# Patient Record
Sex: Female | Born: 1950 | Hispanic: No | Marital: Married | Smoking: Former smoker
Health system: Southern US, Community
[De-identification: ages and names within clinical notes are randomized; demographics above are authoritative.]

## PROBLEM LIST (undated history)

## (undated) DIAGNOSIS — Z8669 Personal history of other diseases of the nervous system and sense organs: Secondary | ICD-10-CM

## (undated) DIAGNOSIS — I959 Hypotension, unspecified: Secondary | ICD-10-CM

## (undated) DIAGNOSIS — M1990 Unspecified osteoarthritis, unspecified site: Secondary | ICD-10-CM

## (undated) DIAGNOSIS — E669 Obesity, unspecified: Secondary | ICD-10-CM

## (undated) DIAGNOSIS — Z91013 Allergy to seafood: Secondary | ICD-10-CM

## (undated) DIAGNOSIS — E785 Hyperlipidemia, unspecified: Secondary | ICD-10-CM

## (undated) HISTORY — PX: HIP SURGERY: SHX245

## (undated) HISTORY — DX: Personal history of other diseases of the nervous system and sense organs: Z86.69

## (undated) HISTORY — DX: Hypotension, unspecified: I95.9

## (undated) HISTORY — PX: FOOT SURGERY: SHX648

## (undated) HISTORY — DX: Unspecified osteoarthritis, unspecified site: M19.90

## (undated) HISTORY — DX: Allergy to seafood: Z91.013

## (undated) HISTORY — PX: DERMABRASION OF ARM: SUR410

## (undated) HISTORY — DX: Hyperlipidemia, unspecified: E78.5

## (undated) HISTORY — DX: Obesity, unspecified: E66.9

---

## 2005-09-29 ENCOUNTER — Ambulatory Visit: Payer: Self-pay | Admitting: Family Medicine

## 2007-01-21 ENCOUNTER — Ambulatory Visit: Payer: Self-pay | Admitting: Family Medicine

## 2007-03-10 ENCOUNTER — Encounter: Payer: Self-pay | Admitting: Internal Medicine

## 2007-03-10 DIAGNOSIS — Z8701 Personal history of pneumonia (recurrent): Secondary | ICD-10-CM | POA: Insufficient documentation

## 2007-03-10 DIAGNOSIS — G43909 Migraine, unspecified, not intractable, without status migrainosus: Secondary | ICD-10-CM

## 2007-03-19 ENCOUNTER — Encounter: Payer: Self-pay | Admitting: Internal Medicine

## 2007-03-22 ENCOUNTER — Ambulatory Visit: Payer: Self-pay | Admitting: Family Medicine

## 2007-03-22 DIAGNOSIS — L509 Urticaria, unspecified: Secondary | ICD-10-CM | POA: Insufficient documentation

## 2007-03-22 DIAGNOSIS — R635 Abnormal weight gain: Secondary | ICD-10-CM

## 2007-04-01 ENCOUNTER — Encounter (INDEPENDENT_AMBULATORY_CARE_PROVIDER_SITE_OTHER): Payer: Self-pay | Admitting: Internal Medicine

## 2007-04-28 ENCOUNTER — Ambulatory Visit: Payer: Self-pay | Admitting: Family Medicine

## 2007-12-07 ENCOUNTER — Emergency Department: Payer: Self-pay | Admitting: Emergency Medicine

## 2008-01-30 IMAGING — CR DG SHOULDER 1V*L*
1 series · 1 of 1 positions shown · non-contrast
Comparison: none

REASON FOR EXAM: Post reduction x-ray for left shoulder dislocation
COMMENTS:   LMP: Post-Menopausal

PROCEDURE:     DXR - DXR SHOULDER LEFT ONE VIEW  - December 07, 2007  [DATE]
RESULT:     The patient has undergone interval relocation of the previously
dislocated LEFT shoulder. Alignment now appears anatomic. The AC joint is
grossly intact. I see no evidence of acute fracture.

[view not recorded]
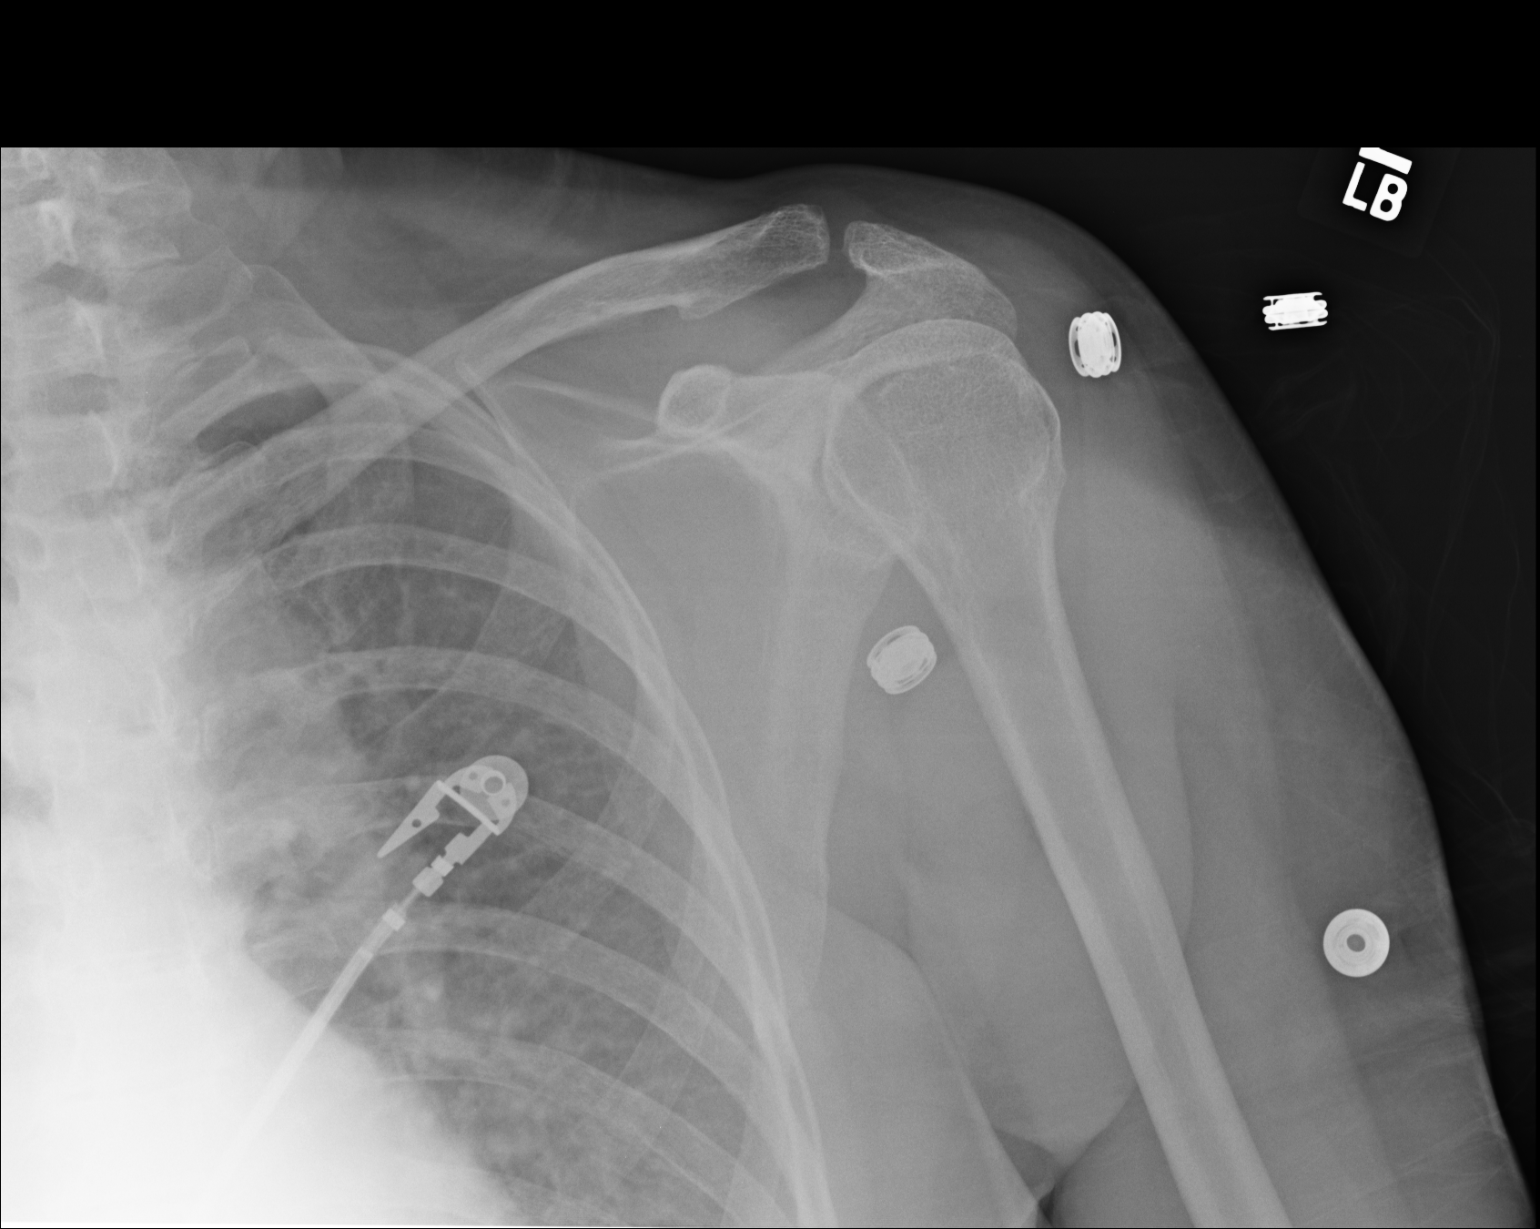

[1 of 1 positions shown; findings below may reference images not displayed]

IMPRESSION: The patient has undergone successful relocation of the
previously dislocated LEFT shoulder.

## 2008-01-30 IMAGING — CR DG ELBOW 2V*L*
1 series · 1 of 1 positions shown · non-contrast
Comparison: none

REASON FOR EXAM: Post reduction x-ray
COMMENTS:   LMP: Post-Menopausal

[view not recorded]
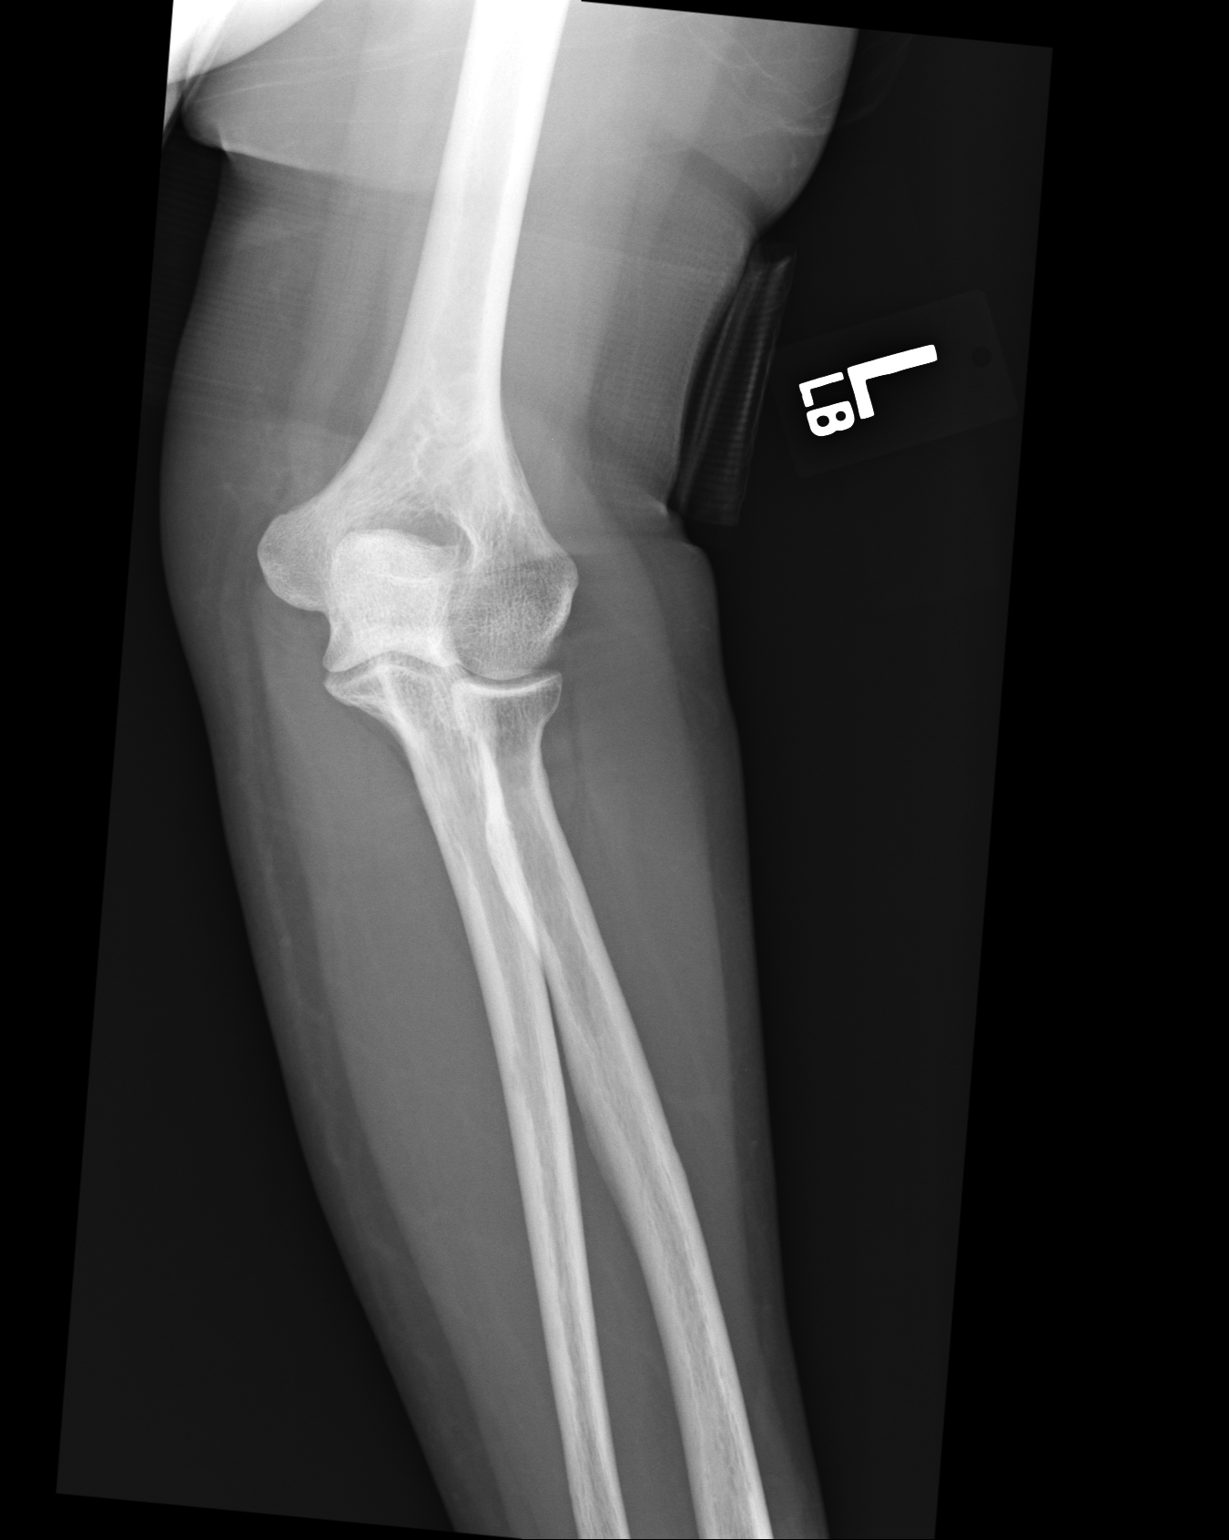

[1 of 1 positions shown; findings below may reference images not displayed]

PROCEDURE:     DXR - DXR ELBOW LEFT AP AND LATERAL  - December 07, 2007  [DATE]

RESULT:     The patient is undergoing evaluation of the LEFT elbow due to an
unusual appearance on an accompanying study of the dislocated LEFT shoulder.
The radial head appears intact. The olecranon also appears intact. The
distal humerus is normal in appearance.
IMPRESSION: I do not see evidence of acute fracture of the LEFT elbow.

## 2008-04-27 ENCOUNTER — Encounter (INDEPENDENT_AMBULATORY_CARE_PROVIDER_SITE_OTHER): Payer: Self-pay | Admitting: Internal Medicine

## 2008-04-28 ENCOUNTER — Telehealth (INDEPENDENT_AMBULATORY_CARE_PROVIDER_SITE_OTHER): Payer: Self-pay | Admitting: Internal Medicine

## 2008-06-13 ENCOUNTER — Ambulatory Visit: Payer: Self-pay | Admitting: Family Medicine

## 2008-06-22 ENCOUNTER — Ambulatory Visit: Payer: Self-pay | Admitting: Family Medicine

## 2008-08-15 IMAGING — US US EXTREM UP VENOUS*L*
1 series · 18 of 24 positions shown · non-contrast
Comparison: none

REASON FOR EXAM: Swelling left wrist, evaluate for blood clot
   Call report to: [phone]
COMMENTS:

PROCEDURE:     US  - US DOPPLER UP EXTR LEFT  - June 22, 2008  [DATE]
RESULT:     Doppler examination of the venous system of the LEFT upper
extremity was performed from the level of the jugular vein peripherally to
the elbow. No thrombus or occlusion is seen.

[Series 1: us extrem up venous*left* · 18 of 39 slices shown]
[im 1/39]
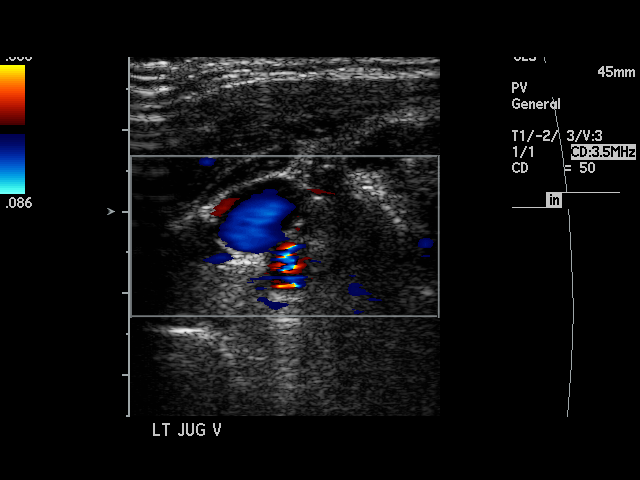
[im 4/39]
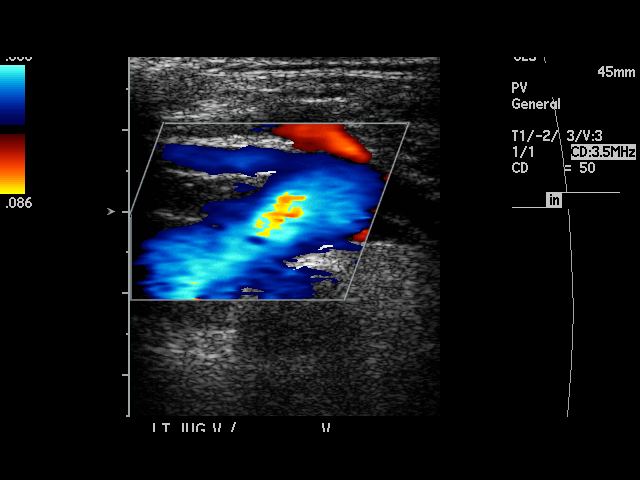
[im 5/39]
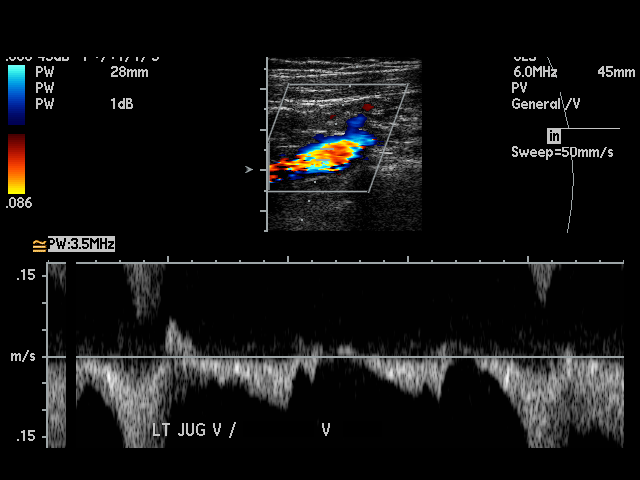
[im 7/39]
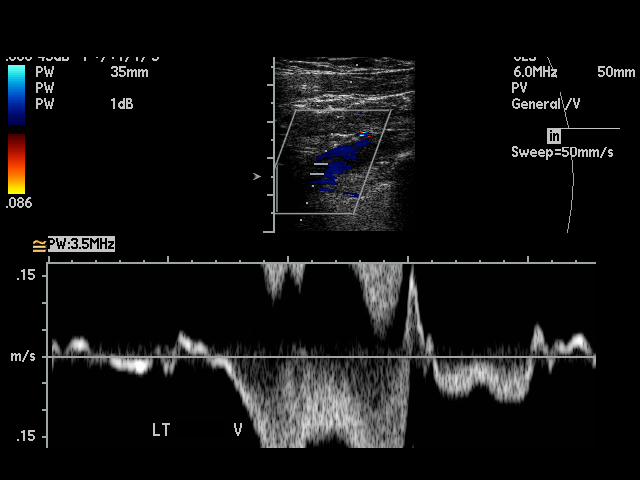
[im 10/39]
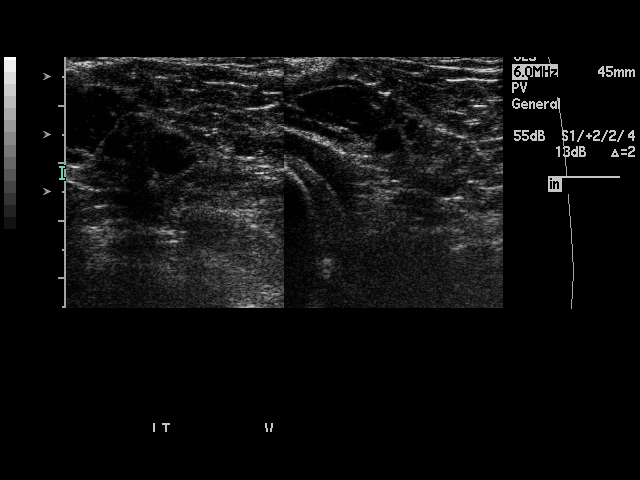
[im 12/39]
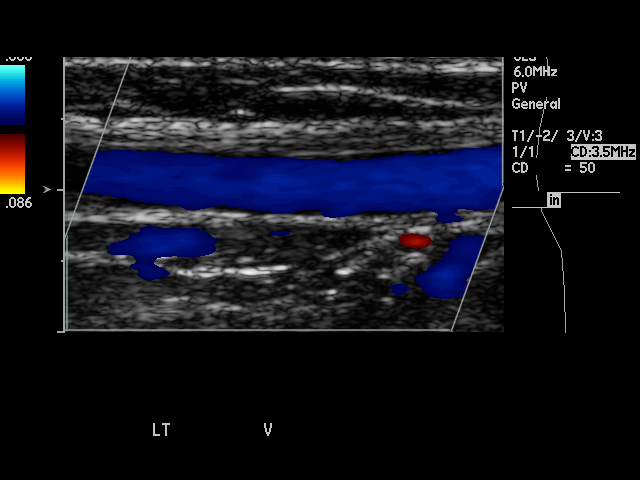
[im 14/39]
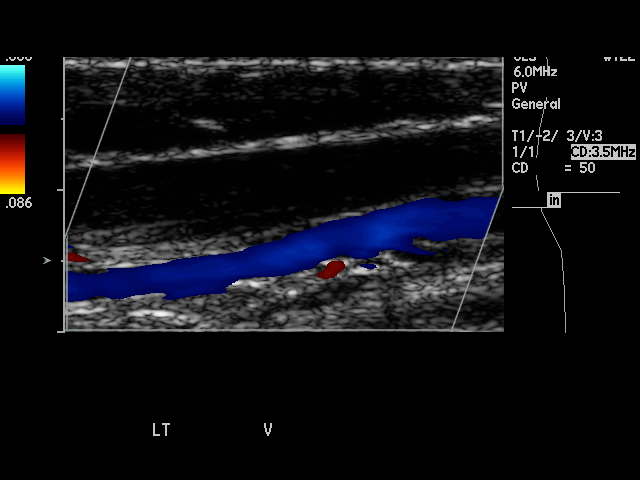
[im 17/39]
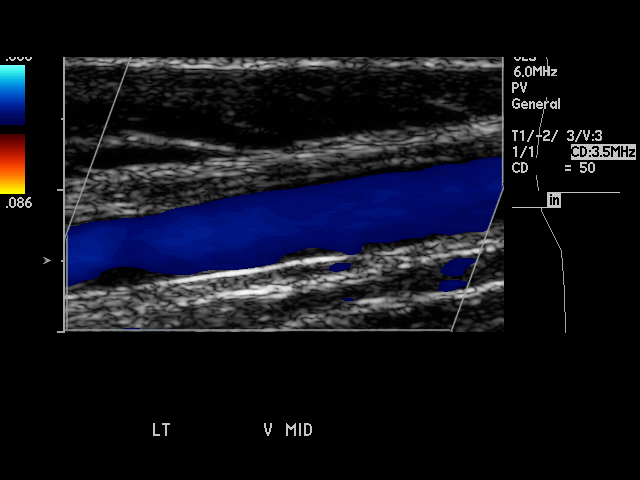
[im 19/39]
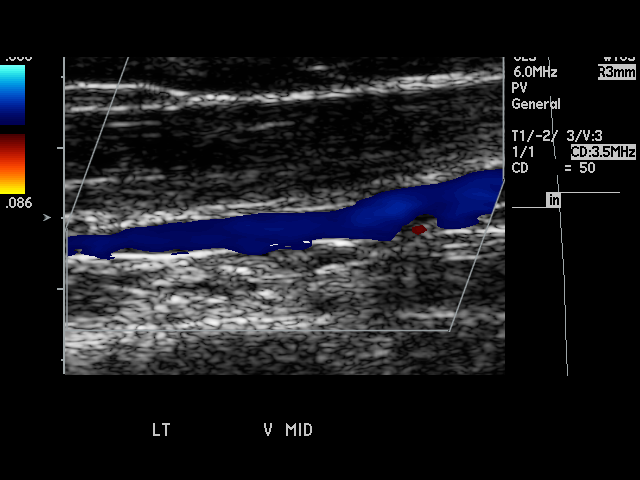
[im 20/39]
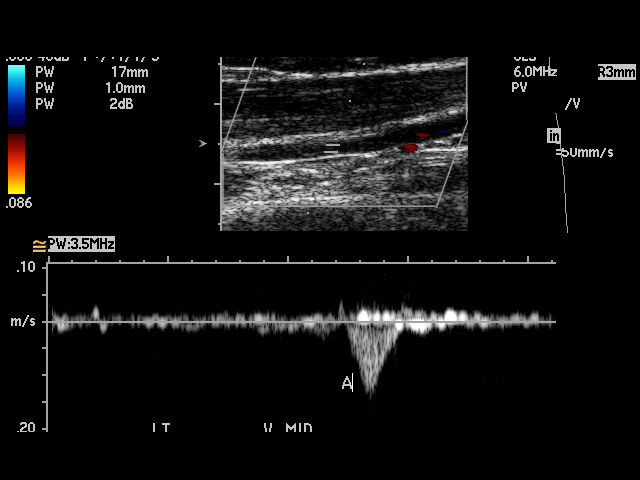
[im 24/39]
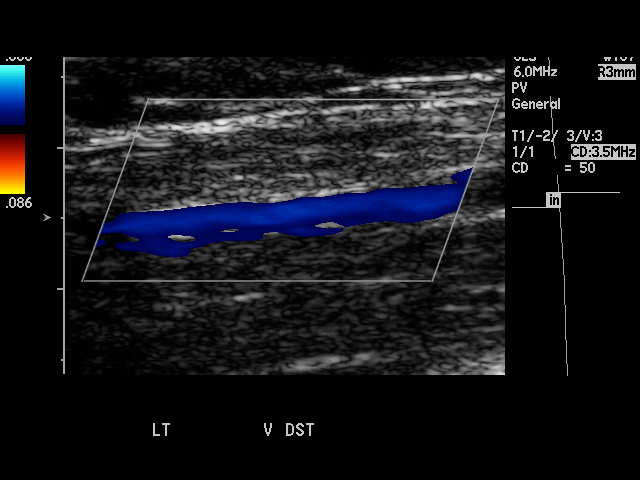
[im 25/39]
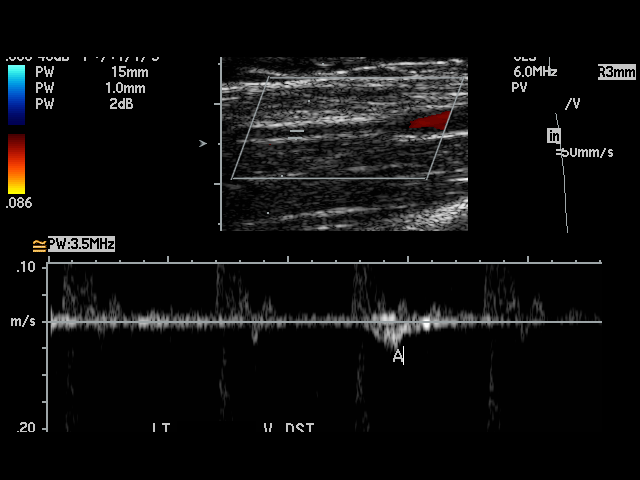
[im 27/39]
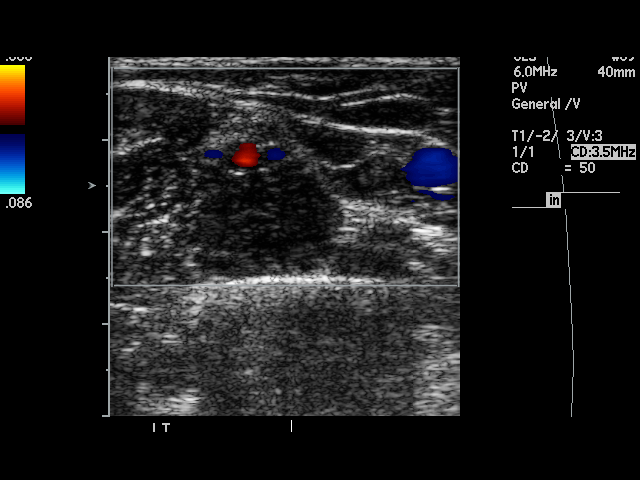
[im 30/39]
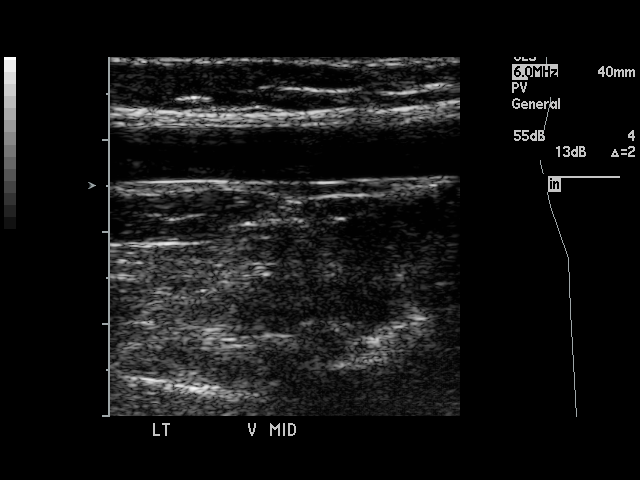
[im 32/39]
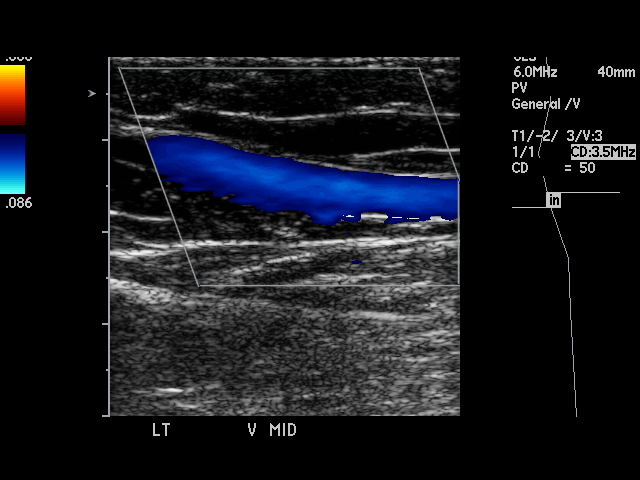
[im 34/39]
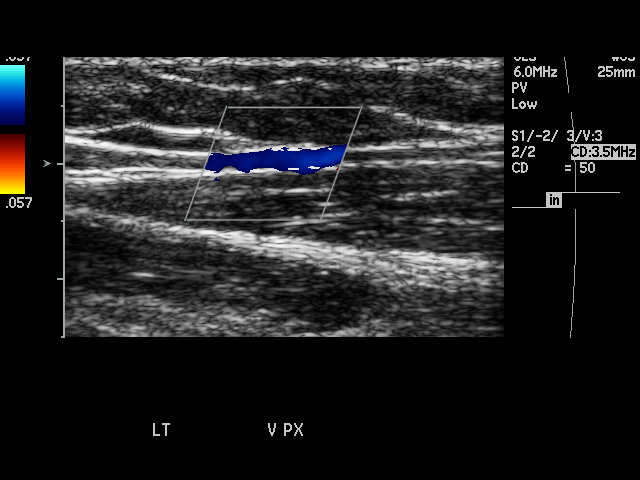
[im 37/39]
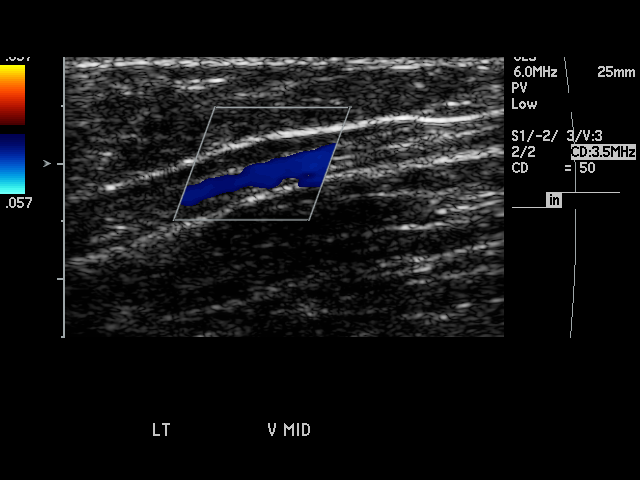
[im 39/39]
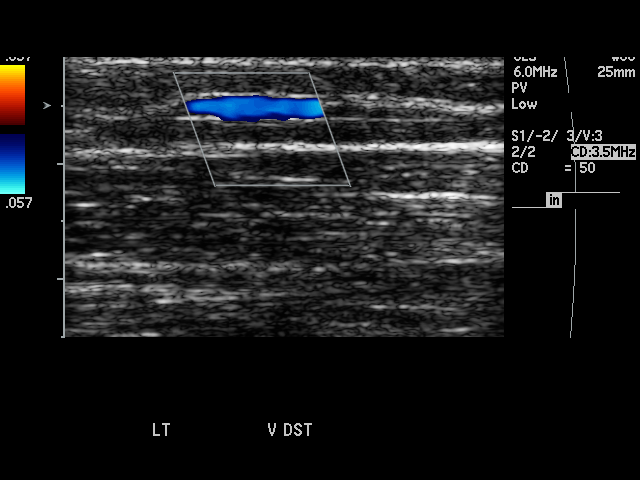

[18 of 24 positions shown; findings below may reference images not displayed]

IMPRESSION: Normal study.

## 2008-11-24 HISTORY — PX: TOTAL HIP ARTHROPLASTY: SHX124

## 2009-03-16 ENCOUNTER — Telehealth (INDEPENDENT_AMBULATORY_CARE_PROVIDER_SITE_OTHER): Payer: Self-pay | Admitting: Internal Medicine

## 2009-05-04 ENCOUNTER — Encounter (INDEPENDENT_AMBULATORY_CARE_PROVIDER_SITE_OTHER): Payer: Self-pay | Admitting: Internal Medicine

## 2009-05-30 ENCOUNTER — Encounter (INDEPENDENT_AMBULATORY_CARE_PROVIDER_SITE_OTHER): Payer: Self-pay | Admitting: Internal Medicine

## 2009-08-22 ENCOUNTER — Ambulatory Visit: Payer: Self-pay | Admitting: Family Medicine

## 2009-08-22 LAB — CONVERTED CEMR LAB
Bilirubin Urine: NEGATIVE
Specific Gravity, Urine: 1.01
pH: 6

## 2009-11-01 ENCOUNTER — Encounter (INDEPENDENT_AMBULATORY_CARE_PROVIDER_SITE_OTHER): Payer: Self-pay | Admitting: Internal Medicine

## 2010-01-25 ENCOUNTER — Ambulatory Visit: Payer: Self-pay | Admitting: Family Medicine

## 2010-01-29 ENCOUNTER — Telehealth: Payer: Self-pay | Admitting: Family Medicine

## 2010-01-30 ENCOUNTER — Ambulatory Visit: Payer: Self-pay | Admitting: Family Medicine

## 2010-01-30 DIAGNOSIS — J209 Acute bronchitis, unspecified: Secondary | ICD-10-CM

## 2010-04-25 ENCOUNTER — Encounter: Payer: Self-pay | Admitting: Family Medicine

## 2010-09-10 ENCOUNTER — Encounter: Payer: Self-pay | Admitting: Family Medicine

## 2010-09-10 ENCOUNTER — Ambulatory Visit: Payer: Self-pay | Admitting: Internal Medicine

## 2010-09-10 DIAGNOSIS — B353 Tinea pedis: Secondary | ICD-10-CM | POA: Insufficient documentation

## 2010-09-10 DIAGNOSIS — Z91013 Allergy to seafood: Secondary | ICD-10-CM

## 2010-09-10 LAB — CONVERTED CEMR LAB
BUN: 17 mg/dL
CO2: 25 meq/L
Calcium: 9.1 mg/dL
Creatinine, Ser: 0.74 mg/dL
Glucose, Bld: 95 mg/dL

## 2010-09-11 ENCOUNTER — Encounter: Payer: Self-pay | Admitting: Family Medicine

## 2010-09-19 ENCOUNTER — Telehealth: Payer: Self-pay | Admitting: Family Medicine

## 2010-09-29 ENCOUNTER — Encounter: Payer: Self-pay | Admitting: Family Medicine

## 2010-10-03 ENCOUNTER — Encounter (INDEPENDENT_AMBULATORY_CARE_PROVIDER_SITE_OTHER): Payer: Self-pay | Admitting: *Deleted

## 2010-10-03 ENCOUNTER — Encounter: Payer: Self-pay | Admitting: Family Medicine

## 2010-11-28 ENCOUNTER — Encounter: Payer: Self-pay | Admitting: Family Medicine

## 2010-12-24 NOTE — Assessment & Plan Note (Signed)
Summary: TRANSFER FROM BILLIE,REFILL HA MED/CLE   Vital Signs:  Patient profile:   60 year old female Weight:      169 pounds Temp:     98.5 degrees F oral Pulse rate:   76 / minute Pulse rhythm:   regular BP sitting:   112 / 70  (left arm) Cuff size:   regular  Vitals Entered By: Selena Batten Dance CMA Duncan Dull) (September 10, 2010 8:15 AM) CC: Refill Headache meds   History of Present Illness: CC: establish, refill meds.  swims 6d/wk.  eats well.  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    joints - R hip replaced May 2010, went to Dr. Sherin Quarry at [facility].  Practice in [address].  doing well.    migraines - relpax works if taking two or one if takes too early on.  started after menopause.  seem to come in 30 day cycles.  gets one every month.  Skin - wants spot checked on right anterior neck, looks like pimple.  wants to make sure not malignant.  also fungal infection in feet, using OTC lotrimin cream and spray.  preventative - [facility] Dr. Haskel Khan for well woman.  mammo/pap/breast exam normal last year, gets every 2 years.  flu shot 1 year ago.  to get today.  hasn't had blood work since 2008.  due for glu check. fasting today  -  Date:  03/08/2009    TD booster Tdap  Current Medications (verified): 1)  Multivitamins   Tabs (Multiple Vitamin) .... One Daily 2)  Glucosamine-Chondroitin  Caps (Glucosamine-Chondroit-Vit C-Mn) .... Otc As Directed. 3)  Relpax 40 Mg Tabs (Eletriptan Hydrobromide) .... Take 1 At Onset of Migraine, May Repeat in 2 Hrs If Not Resolved As Needed  Allergies: 1)  ! Pcn 2)  ! Morphine 3)  ! 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 Tresa Endo)  Family History: adopted, part native [place]  Social History: Remote smoking, occ EtOH, no rec drugs Occupation: Engineer, civil (consulting)  at [employer]  Marital Status: Married, lives with husband, no pets Children: 3  Review of Systems       The patient complains of weight gain and headaches.  The patient denies anorexia, fever, weight loss, vision loss, decreased hearing, hoarseness, chest pain, syncope, dyspnea on exertion, peripheral edema, prolonged cough, hemoptysis, abdominal pain, melena, hematochezia, severe indigestion/heartburn, hematuria, muscle weakness, difficulty walking, depression, and breast masses.    Physical Exam  General:  Well-developed,well-nourished,in no acute distress; alert,appropriate and cooperative throughout examination Head:  normocephalic, atraumatic, and no abnormalities observed.  no sinus tenderness  Eyes:  vision grossly intact, pupils equal, pupils round, pupils reactive to light, and no injection.   Ears:  R ear normal and L ear normal.   Nose:  nares are clear Mouth:  pharynx pink and moist, no erythema, and no exudates.   Neck:  No deformities, masses, or tenderness noted.  + small raised cherry angioma R anterior neck Lungs:  Normal respiratory effort, chest expands symmetrically. Lungs are clear to auscultation, no crackles or wheezes. Heart:  Normal rate and regular rhythm. S1 and S2 normal without gallop, murmur, click, rub or other extra sounds. Msk:  No deformity or scoliosis noted of thoracic or lumbar spine.   Pulses:  2+ rad pulses Extremities:  no edema Neurologic:  CN grossly  intact, station and gait intact Skin:  feet bilaterally with slight dry skin/cracking soles, L>R,  normal between toes.   Psych:  normal affect, talkative and pleasant    Impression & Recommendations:  Problem # 1:  WEIGHT GAIN (ICD-783.1) check glu.   Orders: TLB-BMP (Basic Metabolic Panel-BMET) (80048-METABOL)  Problem # 2:  WELL ADULT (ICD-V70.0) preventative updated.  iFOB given today, check BMP.  receives well woman from OBGYN  Problem # 3:  MIGRAINES, HX OF  (ICD-V13.8) refilled relpax.  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  sent in epi pen.  pt declines referral to allergist.  Problem # 5:  TINEA PEDIS (ICD-110.4)  twice daily lotrimin, spray in all shoes.  rtc if not improved.  Complete Medication List: 1)  Multivitamins Tabs (Multiple vitamin) .... One daily 2)  Glucosamine-chondroitin Caps (Glucosamine-chondroit-vit c-mn) .... Otc as directed. 3)  Relpax 40 Mg Tabs (Eletriptan hydrobromide) .... Take 1 at onset of migraine, may repeat in 2 hrs if not resolved as needed 4)  Epipen 0.3 Mg/0.5ml Devi (Epinephrine) .... Use as directed.  Patient Instructions: 1)  Return as needed or in 1 year for complete physical.   2)  sent home with iFOB.  Please send to [facility]. 3)  relpax refilled. 4)  Good to meet you today, call clinic with questions. Prescriptions: EPIPEN 0.3 MG/0.3ML DEVI (EPINEPHRINE) use as directed.  #1 x 1   Entered and Authorized by:   [name]  MD   Signed by:   [name]  MD on 09/10/2010   Method used:   Electronically to        [facility]* (retail)       [address]       [place], [place]  [address]       Ph: [phone]       Fax: [phone]   RxID:   [id number] RELPAX 40 MG TABS (ELETRIPTAN HYDROBROMIDE) take 1 at onset of migraine, may repeat in 2 hrs if not resolved as needed  #9 x 6   Entered and Authorized by:   [name]  MD   Signed by:   [name]  MD on 09/10/2010   Method used:   Electronically to        [facility]* (retail)       [address]       [place], [place]  [address]       Ph: [phone]       Fax: [phone]   RxID:   [id number]    Orders Added: 1)  TLB-BMP (Basic Metabolic Panel-BMET) [80048-METABOL] 2)  Est. Patient 40-64 years [[address]]    Current Allergies (reviewed today): ! PCN ! MORPHINE ! DEMEROL  Prevention & Chronic  Care Immunizations   Influenza vaccine: Not documented    Tetanus booster: 03/08/2009: Tdap    Pneumococcal vaccine: Not documented  Colorectal Screening   Hemoccult: Not documented    Colonoscopy: Not documented  Other Screening   Pap smear: Not documented    Mammogram: Not documented   Smoking status: quit  (03/10/2007)  Lipids   Total Cholesterol: Not documented   LDL: Not documented   LDL Direct: Not documented   HDL: Not documented   Triglycerides: Not documented     Appended Document: TRANSFER FROM BILLIE,REFILL HA MED/CLE    Clinical Lists Changes  Orders:  Added new Service order of Specimen Handling (51884) - Signed

## 2010-12-24 NOTE — Miscellaneous (Signed)
Summary: iFOB repeat  Clinical Lists Changes  Observations: Added new observation of HEMOCULTDUE: 09/2011 (10/03/2010 9:46) Added new observation of HEMOCCULT: negative (09/29/2010 9:47)      Preventive Care Screening  Hemoccult:    Date:  09/29/2010    Next Due:  09/2011    Results:  negative

## 2010-12-24 NOTE — Letter (Signed)
Summary: [facility]: Immunoassay Fecal Occult Blood (iFOB) Order Form  [place] at [facility]  [address] [place], [place] [address]   Phone: [phone]  Fax: [phone]      [facility]: Immunoassay Fecal Occult Blood (iFOB) Order Form   September 10, 2010 MRN: [id number]   Dana Alvarez [birth year]   Physicican Name:_________________________  Diagnosis Code:__________V76.49_______________      [name]  MD

## 2010-12-24 NOTE — Assessment & Plan Note (Signed)
Summary: ?BRONCHITIS   Vital Signs:  Patient profile:   60 year old female Weight:      166 pounds BMI:     32.00 Temp:     98.7 degrees F oral Pulse rate:   84 / minute Pulse rhythm:   regular BP sitting:   132 / 90  (left arm) Cuff size:   regular  Vitals Entered By: Lowella Petties CMA (January 25, 2010 2:03 PM) CC: Flu like symptoms, non productive cough.  Taking mucinex and ibuprofen   History of Present Illness: was exp to uri at work -- coughed on   she started symptoms wed night -- came on suddenly with chills and low grade fever ear and sinus pain dry cough - cannot get anything up  100 or above temp when not on ibup-- but her nl is 97   bp is up today -- is on mucinex D also   has hx of pneumonia in the past   got flu shot   no n/v/d  some loss of appetite  Allergies: 1)  ! Pcn  Past History:  Past Surgical History: Last updated: 08/22/2009 C-SECTIONS, X'S 3 DERMABRASION RIGHT ARM/ PLASTIC SURGERY  total R hip 03/2009  Social History: Last updated: 08/22/2009 Marital Status: Married Children: 3 Occupation: Engineer, civil (consulting) at [employer]   Risk Factors: Exercise: yes (06/13/2008)  Risk Factors: Smoking Status: quit (03/10/2007)  Review of Systems General:  Complains of chills, fatigue, and fever; denies malaise. Eyes:  Denies blurring and eye irritation. ENT:  Complains of earache, hoarseness, nasal congestion, postnasal drainage, sinus pressure, and sore throat. CV:  Denies chest pain or discomfort and lightheadness. Resp:  Complains of cough; denies shortness of breath, sputum productive, and wheezing. GI:  Denies abdominal pain, bloody stools, change in bowel habits, nausea, and vomiting. Derm:  Denies lesion(s), poor wound healing, and rash.  Physical Exam  General:  overweight but generally well appearing  Head:  normocephalic, atraumatic, and no abnormalities observed.  no sinus tenderness Eyes:  vision grossly intact, pupils equal,  pupils round, pupils reactive to light, and no injection.   Ears:  R ear normal and L ear normal.   Nose:  injected nares with clear rhinorrhea  Mouth:  pharynx pink and moist, no erythema, and no exudates.  some clear post nasal drip Neck:  No deformities, masses, or tenderness noted. Lungs:  CTA good air exch  harsh bs at bases  Heart:  Normal rate and regular rhythm. S1 and S2 normal without gallop, murmur, click, rub or other extra sounds. Skin:  Intact without suspicious lesions or rashes Cervical Nodes:  No lymphadenopathy noted Psych:  normal affect, talkative and pleasant    Impression & Recommendations:  Problem # 1:  INFLUENZA (ICD-487.8) Assessment New with resp symptoms  within window for tamiflu -- 5 d course sympt control - antipyretics and guif-cod update if worse prod cough (pt is prone to pneumonia)  pt advised to update me if symptoms worsen or do not improve   Complete Medication List: 1)  Multivitamins Tabs (Multiple vitamin) .... One daily 2)  Glucosamine-chondroitin Caps (Glucosamine-chondroit-vit c-mn) .... Otc as directed. 3)  Relpax 40 Mg Tabs (Eletriptan hydrobromide) .... Take 1 at onset of migraine, may repeat in 2 hrs if not resolved 4)  Tamiflu 75 Mg Caps (Oseltamivir phosphate) .Marland Kitchen.. 1 by mouth two times a day for 5 days 5)  Guaifenesin-codeine 100-10 Mg/55ml Syrp (Guaifenesin-codeine) .Marland Kitchen.. 1-2 teaspoons by mouth up to every  4-6 hours as needed cough 6)  Proair Hfa 108 (90 Base) Mcg/act Aers (Albuterol sulfate) .... 2 puffs up to every 4 hours as needed wheeze/ tight chest  Patient Instructions: 1)  take the tamiflu as directed  2)  try the cough syrup as directed with caution- will sedate  3)  drink lots of fluids and get rest  4)  if worse - esp prod cough -- please call and update me  5)  tylenol or motrin for fever  Prescriptions: RELPAX 40 MG TABS (ELETRIPTAN HYDROBROMIDE) take 1 at onset of migraine, may repeat in 2 hrs if not resolved  #12 x  6   Entered and Authorized by:   Judith Part MD   Signed by:   Judith Part MD on 01/25/2010   Method used:   Electronically to        [facility]* (retail)       [address]       [place], [place]  [address]       Ph: [phone]       Fax: [phone]   RxID:   [id number] PROAIR HFA 108 (90 BASE) MCG/ACT AERS (ALBUTEROL SULFATE) 2 puffs up to every 4 hours as needed wheeze/ tight chest  #1 mdi x 0   Entered and Authorized by:   Judith Part MD   Signed by:   Judith Part MD on 01/25/2010   Method used:   Print then Give to Patient   RxID:   [id number] GUAIFENESIN-CODEINE 100-10 MG/5ML SYRP (GUAIFENESIN-CODEINE) 1-2 teaspoons by mouth up to every 4-6 hours as needed cough  #120cc x 0   Entered and Authorized by:   Judith Part MD   Signed by:   Judith Part MD on 01/25/2010   Method used:   Print then Give to Patient   RxID:   [id number] TAMIFLU 75 MG CAPS (OSELTAMIVIR PHOSPHATE) 1 by mouth two times a day for 5 days  #10 x 0   Entered and Authorized by:   Judith Part MD   Signed by:   Judith Part MD on 01/25/2010   Method used:   Print then Give to Patient   RxID:   [id number]   Prior Medications (reviewed today): MULTIVITAMINS   TABS (MULTIPLE VITAMIN) one daily GLUCOSAMINE-CHONDROITIN  CAPS (GLUCOSAMINE-CHONDROIT-VIT C-MN) OTC As directed. RELPAX 40 MG TABS (ELETRIPTAN HYDROBROMIDE) take 1 at onset of migraine, may repeat in 2 hrs if not resolved TAMIFLU 75 MG CAPS (OSELTAMIVIR PHOSPHATE) 1 by mouth two times a day for 5 days GUAIFENESIN-CODEINE 100-10 MG/5ML SYRP (GUAIFENESIN-CODEINE) 1-2 teaspoons by mouth up to every 4-6 hours as needed cough PROAIR HFA 108 (90 BASE) MCG/ACT AERS (ALBUTEROL SULFATE) 2 puffs up to every 4 hours as needed wheeze/ tight chest Current Allergies: ! PCN

## 2010-12-24 NOTE — Miscellaneous (Signed)
  Clinical Lists Changes  Observations: Added new observation of CALCIUM: 9.1 mg/dL (16/08/9603 54:09) Added new observation of CREATININE: 0.74 mg/dL (81/19/1478 29:56) Added new observation of BUN: 17 mg/dL (21/30/8657 84:69) Added new observation of BG RANDOM: 95 mg/dL (62/95/2841 32:44) Added new observation of CO2 PLSM/SER: 25 meq/L (09/10/2010 13:20) Added new observation of CL SERUM: 103 meq/L (09/10/2010 13:20) Added new observation of K SERUM: 4.3 meq/L (09/10/2010 13:20) Added new observation of NA: 141 meq/L (09/10/2010 13:20)

## 2010-12-24 NOTE — Assessment & Plan Note (Signed)
Summary: SOB WITH WHEEZING PER DR Candiss Galeana/RI   Vital Signs:  Patient profile:   60 year old female Height:      60.5 inches Weight:      164.25 pounds BMI:     31.66 O2 Sat:      95 % on Room air Temp:     97.9 degrees F oral Pulse rate:   84 / minute Pulse rhythm:   regular Resp:     24 per minute BP sitting:   120 / 74  (left arm) Cuff size:   regular  Vitals Entered By: Lewanda Rife LPN (January 31, 4539 2:25 PM)  O2 Flow:  Room air  History of Present Illness: a lot of wheezing - getting over the flu  cough is not slowing down- but continues to be dry chest is sore and prickly and crackling / rattling  fever is gone   is wheezing and at times short of breath  mdi only works for about 2 hours  some headache - top of her head     Allergies: 1)  ! Pcn  Past History:  Past Surgical History: Last updated: 08/22/2009 C-SECTIONS, X'S 3 DERMABRASION RIGHT ARM/ PLASTIC SURGERY  total R hip 03/2009  Social History: Last updated: 08/22/2009 Marital Status: Married Children: 3 Occupation: Engineer, civil (consulting) at [employer]   Risk Factors: Exercise: yes (06/13/2008)  Risk Factors: Smoking Status: quit (03/10/2007)  Past Medical History: migraines  reactive airways  Review of Systems General:  Complains of fatigue; denies chills, fever, loss of appetite, and malaise. Eyes:  Denies discharge and eye irritation. ENT:  Complains of postnasal drainage; denies nasal congestion, sinus pressure, and sore throat. CV:  Denies chest pain or discomfort and palpitations. Resp:  Complains of cough, shortness of breath, and wheezing; denies pleuritic. GI:  Denies change in bowel habits, indigestion, nausea, and vomiting. MS:  Denies muscle aches. Derm:  Denies rash.  Physical Exam  General:  Well-developed,well-nourished,in no acute distress; alert,appropriate and cooperative throughout examination Head:  normocephalic, atraumatic, and no abnormalities observed.  no sinus  tenderness  Eyes:  vision grossly intact, pupils equal, pupils round, pupils reactive to light, and no injection.   Ears:  R ear normal and L ear normal.   Nose:  nares are clear Mouth:  pharynx pink and moist, no erythema, and no exudates.   Neck:  No deformities, masses, or tenderness noted. Lungs:  CTA with diffuse exp wheeze and slt prolonged exp phase  no rales or rhonchi or crackles  bs at bases are harsh and tubular pt is not visibly sob  Heart:  Normal rate and regular rhythm. S1 and S2 normal without gallop, murmur, click, rub or other extra sounds. Skin:  Intact without suspicious lesions or rashes Cervical Nodes:  No lymphadenopathy noted Psych:  normal affect, talkative and pleasant    Impression & Recommendations:  Problem # 1:  INFLUENZA (ICD-487.8) Assessment Improved now with reactive airways/ asthmatic bronchitis  will cover with zithromax and for wheeze- prednisone taper  disc side eff of prednisone in detail adv to continue mdi as needed  update if worse or not imp in several days   Complete Medication List: 1)  Multivitamins Tabs (Multiple vitamin) .... One daily 2)  Glucosamine-chondroitin Caps (Glucosamine-chondroit-vit c-mn) .... Otc as directed. 3)  Relpax 40 Mg Tabs (Eletriptan hydrobromide) .... Take 1 at onset of migraine, may repeat in 2 hrs if not resolved as needed 4)  Guaifenesin-codeine 100-10 Mg/48ml Syrp (Guaifenesin-codeine) .Marland KitchenMarland KitchenMarland Kitchen  1-2 teaspoons by mouth up to every 4-6 hours as needed cough 5)  Proair Hfa 108 (90 Base) Mcg/act Aers (Albuterol sulfate) .... 2 puffs up to every 4 hours as needed wheeze/ tight chest 6)  Ibuprofen 200 Mg Tabs (Ibuprofen) .... Otc as directed. 7)  Mucinex D 60-600 Mg Xr12h-tab (Pseudoephedrine-guaifenesin) .... Otc as directed. 8)  Zithromax Z-pak 250 Mg Tabs (Azithromycin) .... Take by mouth as directed 9)  Prednisone 10 Mg Tabs (Prednisone) .... Take by mouth as directed  Patient Instructions: 1)  take the  prednisone as follows :  2)  3 pills once daily as directed for 3 days then 3)  2 pills once daily as directed for 3 days then 4)  1 by mouth once daily for 3 days and then stop  5)  take the zithromax as directed to cover for bronchitis 6)  please update me if worse or fever  7)  use your inhaler as needed  8)    Prescriptions: RELPAX 40 MG TABS (ELETRIPTAN HYDROBROMIDE) take 1 at onset of migraine, may repeat in 2 hrs if not resolved as needed  #9 x 5   Entered and Authorized by:   Judith Part MD   Signed by:   Judith Part MD on 01/30/2010   Method used:   Electronically to        [facility]* (retail)       [address]       [place], [place]  [address]       Ph: [phone]       Fax: [phone]   RxID:   [id number] PREDNISONE 10 MG TABS (PREDNISONE) take by mouth as directed  #18 x 0   Entered and Authorized by:   Judith Part MD   Signed by:   Judith Part MD on 01/30/2010   Method used:   Electronically to        [facility]* (retail)       [address]       [place], [place]  [address]       Ph: [phone]       Fax: [phone]   RxID:   [id number] ZITHROMAX Z-PAK 250 MG TABS (AZITHROMYCIN) take by mouth as directed  #1 pack x 0   Entered and Authorized by:   Judith Part MD   Signed by:   Judith Part MD on 01/30/2010   Method used:   Electronically to        [facility]* (retail)       [address]       [place], [place]  [address]       Ph: [phone]       Fax: [phone]   RxID:   [id number]   Current Allergies (reviewed today): ! PCN

## 2010-12-24 NOTE — Progress Notes (Signed)
Summary: [facility] script request for iFOB  Phone Note Other Incoming   Caller: patient Summary of Call: Patient will not be doing ifob test through [facility] lab, she will have it done through [facility]. Initial call taken by: Mills Koller,  September 19, 2010 9:48 AM  Follow-up for Phone Call        provided husband with script for iFOB through [facility]. Follow-up by: [name]  MD,  September 19, 2010 9:52 AM

## 2010-12-24 NOTE — Letter (Signed)
Summary: Results Follow up Letter  [place] at [facility]  [address] [place], [place] [address]   Phone: [phone]  Fax: [phone]    10/03/2010 MRN: [id number]  Dana Alvarez [address] [place], [place]  [address]  Dear Ms. Dana Alvarez,  The following are the results of your recent test(s):  Test         Result    Pap Smear:        Normal _____  Not Normal _____ Comments: ______________________________________________________ Cholesterol: LDL(Bad cholesterol):         Your goal is less than:         HDL (Good cholesterol):       Your goal is more than: Comments:  ______________________________________________________ Mammogram:        Normal _____  Not Normal _____ Comments:  ___________________________________________________________________ Hemoccult:        Normal __X___  Not normal _______ Comments:  Repeat in 1 year  _____________________________________________________________________ Other Tests:    We routinely do not discuss normal results over the telephone.  If you desire a copy of the results, or you have any questions about this information we can discuss them at your next office visit.   Sincerely,     [place]

## 2010-12-24 NOTE — Progress Notes (Signed)
Summary: SOB with wheezing  Phone Note Call from Patient Call back at [phone]   Caller: Patient Summary of Call: Pt was seen on 3/04 for the flu.  She is better but still has some SOB with wheezing.  This is in the upper chest area. No fever. The  proair works about 60 % of the time but she is asking if there is another inhaler that you can  prescribe to help her get over this hump.  Uses [facility].  Please advise. Initial call taken by: Lowella Petties CMA,  January 29, 2010 3:36 PM  Follow-up for Phone Call        no other inhaler for rescue is better than that one I'm wondering if she may need prednisone-- ?   need to listen to her lungs -- please sched f/u when able  if worse after hours- go to UC  Follow-up by: Judith Part MD,  January 29, 2010 4:21 PM  Additional Follow-up for Phone Call Additional follow up Details #1::        Patient notified as instructed by telephone. Pt has appt to see Dr Milinda Antis 01/30/10 at 2:15pm. Lewanda Rife LPN  January 29, 1609 4:38 PM

## 2010-12-24 NOTE — Letter (Signed)
Summary: [facility]   Imported By: Lanelle Bal 05/02/2010 10:31:48  _____________________________________________________________________  External Attachment:    Type:   Image     Comment:   External Document

## 2010-12-26 NOTE — Letter (Signed)
Summary: [facility]  [facility]   Imported By: Lanelle Bal 12/09/2010 07:32:01  _____________________________________________________________________  External Attachment:    Type:   Image     Comment:   External Document  Appended Document: [facility] partial records - will request full note  Appended Document: [facility] Complete note requested.

## 2011-05-11 ENCOUNTER — Encounter: Payer: Self-pay | Admitting: Family Medicine

## 2011-09-23 ENCOUNTER — Encounter: Payer: Self-pay | Admitting: Family Medicine

## 2011-09-24 ENCOUNTER — Encounter: Payer: Self-pay | Admitting: Family Medicine

## 2011-09-24 ENCOUNTER — Ambulatory Visit (INDEPENDENT_AMBULATORY_CARE_PROVIDER_SITE_OTHER): Payer: BC Managed Care – PPO | Admitting: Family Medicine

## 2011-09-24 DIAGNOSIS — Z0000 Encounter for general adult medical examination without abnormal findings: Secondary | ICD-10-CM

## 2011-09-24 DIAGNOSIS — Z87898 Personal history of other specified conditions: Secondary | ICD-10-CM

## 2011-09-24 MED ORDER — ALBUTEROL 90 MCG/ACT IN AERS: 2.0000 | INHALATION_SPRAY | Freq: Four times a day (QID) | RESPIRATORY_TRACT | Status: DC | PRN

## 2011-09-24 MED ORDER — RIZATRIPTAN BENZOATE 10 MG PO TABS: 10.0000 mg | ORAL_TABLET | ORAL | Status: DC | PRN

## 2011-09-24 NOTE — Assessment & Plan Note (Signed)
Reviewed preventative protocols, updated Pt will call insurance about shingles. To get flu at work. iFOB sent today. Well woman at [facility].

## 2011-09-24 NOTE — Progress Notes (Signed)
Subjective:    Patient ID: Dana Alvarez, female    DOB: [birth year], 60 y.o.   MRN: [id number]  HPI CC: CPE  No concerns today.  Here for CPE.  Planning on getting knee replacement coming up 2013 - s/p prednisone shots in summer, last well.  H/o R hip replacement 2010.  Dr. Sherin Quarry [facility] in [place].  swims 6d/wk. eats well. 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   migraines - relpax works if taking w during a day, sometimes takes another one next day.  started after menopause.  seem to come in 30 day cycles. gets one every month.  Previously on imitrex, didn't like how she felt.  Also tried generic imitrex.  Wants to try something else.  Not on hormones.  Has tried flexeril and valium in past, knocked her out so wants to stay away from this.  preventative - [facility] Dr. Haskel Khan for well woman. mammo/pap/breast exam normal last year, gets every 2 years. Due February 2013.   Flu clinic next week at school. Would like blood work from [facility]. Declines shingles shot.  Will check with pharmacy. iFOB last year normal.  Would like rpt iFOB.  Medications and allergies reviewed and updated in chart.  Past histories reviewed and updated if relevant as below. Patient Active Problem List  Diagnoses  . TINEA PEDIS  . URTICARIA  . WEIGHT GAIN  . SHELLFISH ALLERGY  . HX, PNEUMONIA  . MIGRAINES, HX OF  . Healthcare maintenance   Past Medical History  Diagnosis Date  . DJD (degenerative joint disease) of hip   . History of migraines   . Toxic effect of fish and shellfish    Past Surgical History  Procedure Date  . Total hip arthroplasty 2010    Right  . Cesarean section     x3  . Dermabrasion of arm     right arm--plastic surgery   History  Substance Use Topics  . Smoking status: Former Smoker -- 20 years    Quit date: 11/25/1987  . Smokeless tobacco: Never Used  . Alcohol Use: Yes     wine--1 glass/biweekly   Family History  Problem Relation Age of Onset    . Adopted: Yes   Allergies  Allergen Reactions  . Meperidine Hcl     REACTION: Hypotension  . Morphine     REACTION: rash  . Penicillins     REACTION: rash   No current outpatient prescriptions on file prior to visit.   Review of Systems  Constitutional: Negative for fever, chills, activity change, appetite change, fatigue and unexpected weight change.  HENT: Negative for hearing loss and neck pain.   Eyes: Negative for visual disturbance.  Respiratory: Negative for cough, chest tightness, shortness of breath and wheezing.   Cardiovascular: Negative for chest pain, palpitations and leg swelling.  Gastrointestinal: Negative for nausea, vomiting, abdominal pain, diarrhea, constipation, blood in stool and abdominal distention.  Genitourinary: Negative for hematuria and difficulty urinating.  Musculoskeletal: Negative for myalgias and arthralgias.  Skin: Negative for rash.  Neurological: Positive for headaches. Negative for dizziness, seizures and syncope.  Hematological: Bruises/bleeds easily.  Psychiatric/Behavioral: Negative for dysphoric mood. The patient is not nervous/anxious.        Objective:   Physical Exam  Nursing note and vitals reviewed. Constitutional: She is oriented to person, place, and time. She appears well-developed and well-nourished. No distress.  HENT:  Head: Normocephalic and atraumatic.  Right Ear: Hearing, tympanic membrane, external ear and ear  canal normal.  Left Ear: Hearing, tympanic membrane, external ear and ear canal normal.  Nose: Nose normal. No mucosal edema or rhinorrhea.  Mouth/Throat: Uvula is midline, oropharynx is clear and moist and mucous membranes are normal. No oropharyngeal exudate, posterior oropharyngeal edema, posterior oropharyngeal erythema or tonsillar abscesses.  Eyes: Conjunctivae and EOM are normal. Pupils are equal, round, and reactive to light. No scleral icterus.  Neck: Normal range of motion. Neck supple.   Cardiovascular: Normal rate, regular rhythm, normal heart sounds and intact distal pulses.   No murmur heard. Pulses:      Radial pulses are 2+ on the right side, and 2+ on the left side.  Pulmonary/Chest: Effort normal and breath sounds normal. No respiratory distress. She has no wheezes. She has no rales.  Abdominal: Soft. Bowel sounds are normal. She exhibits no distension and no mass. There is no tenderness. There is no rebound and no guarding.  Musculoskeletal: Normal range of motion.  Lymphadenopathy:    She has no cervical adenopathy.  Neurological: She is alert and oriented to person, place, and time.       CN grossly intact, station and gait intact  Skin: Skin is warm and dry. No rash noted.  Psychiatric: She has a normal mood and affect. Her behavior is normal. Judgment and thought content normal.      Assessment & Plan:

## 2011-09-24 NOTE — Patient Instructions (Addendum)
Check into shingles shot. Return at your convenience fasting for blood work.  Sent home with stool kit today. Flu shot at work. Good to see you today, call us with questions. We will try different triptan for migraine -sent in rizatriptan (maxalt).

## 2011-09-24 NOTE — Assessment & Plan Note (Signed)
relpax efficacy waning, change triptan to maxalt. Pt wants to avoid muscle relaxants 2/2 sedation. Update [place] if not effective.

## 2011-09-29 ENCOUNTER — Other Ambulatory Visit (INDEPENDENT_AMBULATORY_CARE_PROVIDER_SITE_OTHER): Payer: BC Managed Care – PPO

## 2011-09-29 DIAGNOSIS — Z13220 Encounter for screening for lipoid disorders: Secondary | ICD-10-CM

## 2011-09-29 DIAGNOSIS — Z0000 Encounter for general adult medical examination without abnormal findings: Secondary | ICD-10-CM

## 2011-09-29 NOTE — Progress Notes (Signed)
Addended by: Alvina Chou on: 09/29/2011 08:43 AM   Modules accepted: Orders

## 2011-10-02 ENCOUNTER — Encounter: Payer: Self-pay | Admitting: Family Medicine

## 2011-10-03 ENCOUNTER — Other Ambulatory Visit: Payer: BC Managed Care – PPO

## 2011-10-08 ENCOUNTER — Encounter: Payer: Self-pay | Admitting: *Deleted

## 2011-10-08 ENCOUNTER — Other Ambulatory Visit: Payer: Self-pay | Admitting: Family Medicine

## 2011-10-08 DIAGNOSIS — Z1211 Encounter for screening for malignant neoplasm of colon: Secondary | ICD-10-CM

## 2011-10-08 LAB — FECAL OCCULT BLOOD, IMMUNOCHEMICAL: Fecal Occult Bld: NEGATIVE

## 2011-11-25 HISTORY — PX: REPLACEMENT TOTAL KNEE BILATERAL: SUR1225

## 2012-01-21 ENCOUNTER — Ambulatory Visit (INDEPENDENT_AMBULATORY_CARE_PROVIDER_SITE_OTHER): Payer: BC Managed Care – PPO | Admitting: Family Medicine

## 2012-01-21 ENCOUNTER — Encounter: Payer: Self-pay | Admitting: Family Medicine

## 2012-01-21 DIAGNOSIS — R05 Cough: Secondary | ICD-10-CM

## 2012-01-21 DIAGNOSIS — R059 Cough, unspecified: Secondary | ICD-10-CM | POA: Insufficient documentation

## 2012-01-21 MED ORDER — ALBUTEROL SULFATE HFA 108 (90 BASE) MCG/ACT IN AERS: 2.0000 | INHALATION_SPRAY | Freq: Four times a day (QID) | RESPIRATORY_TRACT | Status: DC | PRN

## 2012-01-21 MED ORDER — AZITHROMYCIN 250 MG PO TABS: ORAL_TABLET | ORAL | Status: AC

## 2012-01-21 NOTE — Patient Instructions (Signed)
Start the antibiotics today.  Use the inhaler if needed.  Drink plenty of fluids, take tylenol as needed, and gargle with warm salt water for your throat.  Rest your voice.  This should gradually improve.  Take care.  Let us know if you have other concerns.

## 2012-01-21 NOTE — Assessment & Plan Note (Signed)
Nontoxic, but given her history will cover with zmax.  Use SABA prn and f/u prn.  She agrees.  Note give for work.

## 2012-01-21 NOTE — Progress Notes (Signed)
Mult sick contacts at work, Tax adviser. duration of symptoms: started 5 days ago Rhinorrhea: no congestion:yes ear pain:yes, ringing sore throat: no but hoarse voice x2 days Cough: yes, with some discolored sputum Myalgias: yes other concerns:sinus pain and chest congestion, pain with cough.   Albuterol HFA is empty.   Had a flu shot this fall.    ROS: See HPI.  Otherwise negative.    Meds, vitals, and allergies reviewed.   GEN: nad, alert and oriented HEENT: mucous membranes moist, TM w/o erythema, nasal epithelium injected, OP with cobblestoning NECK: supple w/o LA CV: rrr. PULM: ctab, no inc wob ABD: soft, +bs EXT: no edema Sternal pain with deep breath, better with compression.

## 2012-01-26 ENCOUNTER — Telehealth: Payer: Self-pay | Admitting: Family Medicine

## 2012-01-26 NOTE — Telephone Encounter (Signed)
To: [facility] (Daytime Triage) Fax: [phone] From: [facility] Date/ Time: 01/26/2012 3:54 PM Taken By: Thomasena Edis, CSR Caller: Drucie Ip Facility: not collected Patient: Dana, Alvarez DOB: January 31, 1951 Phone: [phone] Reason for Call: Patient says she finished the zpack and is not bouncing back and would like a refill on the zpack. Regarding Appointment: Appt Date: Appt Time: Unknown Provider: Reason: Details: Outcome:

## 2012-01-26 NOTE — Telephone Encounter (Signed)
Will route to provider who saw here.  Prescribed zpack 01/21/2012, likely give more time.

## 2012-01-27 NOTE — Telephone Encounter (Signed)
Agreed, I would give this more time.  If not improved by the end of the week, notify us.

## 2012-01-27 NOTE — Telephone Encounter (Signed)
Patient advised.

## 2012-02-10 DIAGNOSIS — M1710 Unilateral primary osteoarthritis, unspecified knee: Secondary | ICD-10-CM | POA: Insufficient documentation

## 2012-04-15 ENCOUNTER — Other Ambulatory Visit: Payer: Self-pay | Admitting: Family Medicine

## 2012-10-26 ENCOUNTER — Other Ambulatory Visit: Payer: Self-pay | Admitting: Family Medicine

## 2012-10-26 NOTE — Telephone Encounter (Signed)
Received refill request electronically from pharmacy. Last office visit 01/21/12. Is it okay to refill medication?

## 2012-11-05 ENCOUNTER — Encounter: Payer: Self-pay | Admitting: Family Medicine

## 2012-11-05 ENCOUNTER — Ambulatory Visit (INDEPENDENT_AMBULATORY_CARE_PROVIDER_SITE_OTHER): Payer: BC Managed Care – PPO | Admitting: Family Medicine

## 2012-11-05 VITALS — BP 108/78 | HR 68 | Temp 98.1°F | Ht 60.5 in | Wt 172.2 lb

## 2012-11-05 DIAGNOSIS — Z87898 Personal history of other specified conditions: Secondary | ICD-10-CM

## 2012-11-05 DIAGNOSIS — Z0000 Encounter for general adult medical examination without abnormal findings: Secondary | ICD-10-CM

## 2012-11-05 DIAGNOSIS — E785 Hyperlipidemia, unspecified: Secondary | ICD-10-CM

## 2012-11-05 DIAGNOSIS — E669 Obesity, unspecified: Secondary | ICD-10-CM

## 2012-11-05 DIAGNOSIS — Z1211 Encounter for screening for malignant neoplasm of colon: Secondary | ICD-10-CM

## 2012-11-05 MED ORDER — RIZATRIPTAN BENZOATE 10 MG PO TABS: 10.0000 mg | ORAL_TABLET | ORAL | Status: DC | PRN

## 2012-11-05 NOTE — Assessment & Plan Note (Addendum)
Preventative protocols reviewed and updated unless pt declined. Discussed healthy diet and lifestyle. Stool kit today Well woman at [facility]. Asked her to check with insurance about shingles and colonoscopy costs.

## 2012-11-05 NOTE — Assessment & Plan Note (Signed)
Refilled maxalt

## 2012-11-05 NOTE — Assessment & Plan Note (Signed)
Body mass index is 33.09 kg/(m^2).

## 2012-11-05 NOTE — Assessment & Plan Note (Signed)
Minimal . No need for meds.

## 2012-11-05 NOTE — Progress Notes (Signed)
Subjective:    Patient ID: Dana Alvarez, female    DOB: 04/09/1951, 61 y.o.   MRN: [id number]  HPI CC: CPE  Dana Alvarez presents today for CPE.  Had bilateral knee replacement 03/2012 ([facility]).  Recovered wonderfully from this.  Very satisfied with results.  Back to swilling 6d/wk.  Migraines - started postmenopausal.  Tends to get monthly.  Maxalt working ok.  No auras.  Usually unilateral R sided.  Due for vision exam.  Preventative: [facility] Dr. Haskel Khan for well woman. Done 09/2012.  Normal mammo/pap/breast exam this year.  gets every 2 years. Flu shot at work. Tdap 2010. Discussed shingles shot. Will check with pharmacy.  iFOB last year normal. Would like rpt iFOB.  Adopted so unknown family history.  Denies BM changes or blood in stool or weight changes. Would like blood work from [facility].  Not fasting today.  Adopted--part native [place] Married; lives with husband; dog 3 children Occupation: school Charity fundraiser Activity: swims daily Diet: good water, fruits/vegetables daily  Wt Readings from Last 3 Encounters:  11/05/12 172 lb 4 oz (78.132 kg)  01/21/12 165 lb 4 oz (74.957 kg)  09/24/11 164 lb 6.4 oz (74.571 kg)   Medications and allergies reviewed and updated in chart.  Past histories reviewed and updated if relevant as below. Patient Active Problem List  Diagnosis  . TINEA PEDIS  . URTICARIA  . WEIGHT GAIN  . SHELLFISH ALLERGY  . HX, PNEUMONIA  . MIGRAINES, HX OF  . Healthcare maintenance  . Cough   Past Medical History  Diagnosis Date  . DJD (degenerative joint disease)     hip and knees  . History of migraines   . Shellfish allergy    Past Surgical History  Procedure Date  . Total hip arthroplasty 2010    Right  . Cesarean section     x3  . Dermabrasion of arm     right arm--plastic surgery  . Replacement total knee bilateral 2013    ([facility])   History  Substance Use Topics  . Smoking status: Former Smoker -- 20 years    Quit date: 11/25/1987   . Smokeless tobacco: Never Used  . Alcohol Use: Yes     Comment: wine--1 glass biweekly   Family History  Problem Relation Age of Onset  . Adopted: Yes   Allergies  Allergen Reactions  . Shellfish-Derived Products Anaphylaxis  . Meperidine Hcl     REACTION: Hypotension  . Morphine     REACTION: rash  . Penicillins     REACTION: rash   Current Outpatient Prescriptions on File Prior to Visit  Medication Sig Dispense Refill  . albuterol (PROVENTIL HFA;VENTOLIN HFA) 108 (90 BASE) MCG/ACT inhaler Inhale 2 puffs into the lungs every 6 (six) hours as needed for wheezing.  1 Inhaler  12  . Cinnamon 500 MG TABS Take 1,000 mg by mouth daily.        . Misc Natural Products (OSTEO BI-FLEX ADV TRIPLE ST PO) Take 1 tablet by mouth daily.        . Multiple Vitamin (MULTIVITAMIN) tablet Take 1 tablet by mouth daily.      . rizatriptan (MAXALT) 10 MG tablet TAKE ONE TABLET AT FIRST SIGN OF        MIGRAINE SYMPTOMS--IF NO RELIEF,        A SECOND TABLET MAY BE TAKEN IN 2 HOURS  10 tablet  3  . vitamin C (ASCORBIC ACID) 500 MG tablet Take 1,000 mg by  mouth daily.          Review of Systems  Constitutional: Negative for fever, chills, activity change, appetite change, fatigue and unexpected weight change.  HENT: Negative for hearing loss and neck pain.   Eyes: Negative for visual disturbance.  Respiratory: Negative for cough, chest tightness, shortness of breath and wheezing.   Cardiovascular: Negative for chest pain, palpitations and leg swelling.  Gastrointestinal: Negative for nausea, vomiting, abdominal pain, diarrhea, constipation, blood in stool and abdominal distention.  Genitourinary: Negative for hematuria and difficulty urinating.  Musculoskeletal: Negative for myalgias and arthralgias.  Skin: Negative for rash.  Neurological: Negative for dizziness, seizures, syncope and headaches.  Hematological: Does not bruise/bleed easily.  Psychiatric/Behavioral: Negative for dysphoric mood. The  patient is not nervous/anxious.        Objective:   Physical Exam  Nursing note and vitals reviewed. Constitutional: She is oriented to person, place, and time. She appears well-developed and well-nourished. No distress.  HENT:  Head: Normocephalic and atraumatic.  Right Ear: Hearing, tympanic membrane, external ear and ear canal normal.  Left Ear: Hearing, tympanic membrane, external ear and ear canal normal.  Nose: Nose normal.  Mouth/Throat: Oropharynx is clear and moist. No oropharyngeal exudate.  Eyes: Conjunctivae normal and EOM are normal. Pupils are equal, round, and reactive to light. No scleral icterus.  Neck: Normal range of motion. Neck supple. Carotid bruit is not present. No thyromegaly present.  Cardiovascular: Normal rate, regular rhythm, normal heart sounds and intact distal pulses.   No murmur heard. Pulses:      Radial pulses are 2+ on the right side, and 2+ on the left side.  Pulmonary/Chest: Effort normal and breath sounds normal. No respiratory distress. She has no wheezes. She has no rales.  Abdominal: Soft. Bowel sounds are normal. She exhibits no distension and no mass. There is no tenderness. There is no rebound and no guarding.  Musculoskeletal: Normal range of motion. She exhibits edema (tr bilat pedal edema).  Lymphadenopathy:    She has no cervical adenopathy.  Neurological: She is alert and oriented to person, place, and time.       CN grossly intact, station and gait intact  Skin: Skin is warm and dry. No rash noted.  Psychiatric: She has a normal mood and affect. Her behavior is normal. Judgment and thought content normal.       Assessment & Plan:

## 2012-11-05 NOTE — Patient Instructions (Addendum)
Call your insurace about the shingles shot to see if it is covered or how much it would cost and where is cheaper (here or pharmacy).  If you want to receive here, call for nurse visit. Return at your convenience fasting for blood work (Monday Dec 30th). Stool kit today. Good to see you today.  Return in 1 year or as needed.

## 2012-11-22 ENCOUNTER — Other Ambulatory Visit: Payer: Self-pay | Admitting: Family Medicine

## 2012-11-22 ENCOUNTER — Other Ambulatory Visit (INDEPENDENT_AMBULATORY_CARE_PROVIDER_SITE_OTHER): Payer: BC Managed Care – PPO

## 2012-11-22 DIAGNOSIS — Z1159 Encounter for screening for other viral diseases: Secondary | ICD-10-CM

## 2012-11-22 DIAGNOSIS — Z0000 Encounter for general adult medical examination without abnormal findings: Secondary | ICD-10-CM

## 2012-11-22 NOTE — Addendum Note (Signed)
Addended by: Baldomero Lamy on: 11/22/2012 08:26 AM   Modules accepted: Orders

## 2012-11-22 NOTE — Addendum Note (Signed)
Addended by: Baldomero Lamy on: 11/22/2012 08:23 AM   Modules accepted: Orders

## 2012-11-23 ENCOUNTER — Encounter: Payer: Self-pay | Admitting: *Deleted

## 2012-11-23 LAB — LIPID PANEL
Chol/HDL Ratio: 2.8 ratio units (ref 0.0–4.4)
HDL: 73 mg/dL (ref >39)
LDL Calculated: 114 mg/dL — ABNORMAL HIGH (ref 0–99)
Triglycerides: 80 mg/dL (ref 0–149)
VLDL Cholesterol Cal: 16 mg/dL (ref 5–40)

## 2012-11-23 LAB — BASIC METABOLIC PANEL
CO2: 26 mmol/L (ref 19–28)
Calcium: 9.3 mg/dL (ref 8.6–10.2)
Chloride: 102 mmol/L (ref 97–108)
Creatinine, Ser: 0.76 mg/dL (ref 0.57–1.00)
Potassium: 5.2 mmol/L (ref 3.5–5.2)
Sodium: 142 mmol/L (ref 134–144)

## 2013-10-31 ENCOUNTER — Other Ambulatory Visit: Payer: Self-pay | Admitting: Family Medicine

## 2013-11-01 ENCOUNTER — Other Ambulatory Visit: Payer: Self-pay | Admitting: *Deleted

## 2013-11-01 MED ORDER — RIZATRIPTAN BENZOATE 10 MG PO TABS: ORAL_TABLET | ORAL | Status: DC

## 2014-02-10 ENCOUNTER — Other Ambulatory Visit: Payer: Self-pay

## 2014-02-10 ENCOUNTER — Other Ambulatory Visit: Payer: Self-pay | Admitting: Family Medicine

## 2014-02-10 MED ORDER — ALBUTEROL SULFATE HFA 108 (90 BASE) MCG/ACT IN AERS: 2.0000 | INHALATION_SPRAY | Freq: Four times a day (QID) | RESPIRATORY_TRACT | Status: DC | PRN

## 2014-02-10 NOTE — Telephone Encounter (Signed)
For one week pt has prod cough with clear phlegm,slight wheezing and that is why pt needs refill albuterol inhaler.No fever or SOB. Pt scheduled CPX on 02/13/14.pt request refill albuterol inhaler until seen on Mon.Please advise.

## 2014-02-13 ENCOUNTER — Encounter: Payer: Self-pay | Admitting: Family Medicine

## 2014-02-13 ENCOUNTER — Ambulatory Visit (INDEPENDENT_AMBULATORY_CARE_PROVIDER_SITE_OTHER): Payer: BC Managed Care – PPO | Admitting: Family Medicine

## 2014-02-13 VITALS — BP 104/64 | HR 70 | Temp 98.0°F | Ht 60.0 in | Wt 182.5 lb

## 2014-02-13 DIAGNOSIS — Z0000 Encounter for general adult medical examination without abnormal findings: Secondary | ICD-10-CM

## 2014-02-13 DIAGNOSIS — B9789 Other viral agents as the cause of diseases classified elsewhere: Secondary | ICD-10-CM

## 2014-02-13 DIAGNOSIS — E785 Hyperlipidemia, unspecified: Secondary | ICD-10-CM

## 2014-02-13 DIAGNOSIS — E669 Obesity, unspecified: Secondary | ICD-10-CM

## 2014-02-13 DIAGNOSIS — G43909 Migraine, unspecified, not intractable, without status migrainosus: Secondary | ICD-10-CM

## 2014-02-13 DIAGNOSIS — J069 Acute upper respiratory infection, unspecified: Secondary | ICD-10-CM

## 2014-02-13 MED ORDER — SUMATRIPTAN SUCCINATE 100 MG PO TABS: 50.0000 mg | ORAL_TABLET | ORAL | Status: DC | PRN

## 2014-02-13 NOTE — Progress Notes (Signed)
BP 104/64  Pulse 70  Temp(Src) 98 F (36.7 C) (Oral)  Ht 5' (1.524 m)  Wt 182 lb 8 oz (82.781 kg)  BMI 35.64 kg/m2  SpO2 97%   CC: CPE  Subjective:    Patient ID: Dana Alvarez, female    DOB: January 19, 1951, 63 y.o.   MRN: [id number]  HPI: Dana Alvarez is a 63 y.o. female presenting on 02/13/2014 for Annual Exam and URI   Migraines - prior well controlled with maxalt, now not as effective.  relpax efficacy waned 2-3 yrs ago.  Prior on sumatriptan/imitrex.  Gets migraine every 5-6 weeks.  Doesn't do well with sedating meds (valium, flexeril).  Over last 2 weeks with URI - low grade fever, nonproductive cough, sinus pressure headache - lasted 10 days and getting some better.  Needed inhaler refilled.  Over last few days noticing productive cough of yellow straw colored sputum.  Noticing increasing itch.  Preventative: [facility] Dr. Vernie Ammons for well woman. Done 09/2012. Normal mammo/pap/breast exam this year. gets every 2 years. iFOB 2012 normal. Did not turn in last year's.  Would like rpt iFOB. Adopted so unknown family history. Denies BM changes or blood in stool or weight changes.  Thinking about colonoscopy. Flu shot at work.  Tdap 2010.  Discussed shingles shot. Will check with pharmacy.  Would like blood work from [place].  Fasting today.  Adopted--part native [place]  Married; lives with husband; dog  3 children  Occupation: school Therapist, sports, prior Licensed conveyancer at [employer], prior Therapist, sports at [employer] Activity: swims daily  Diet: good water, fruits/vegetables daily   Relevant past medical, surgical, family and social history reviewed and updated as indicated.  Allergies and medications reviewed and updated. Current Outpatient Prescriptions on File Prior to Visit  Medication Sig  . albuterol (PROVENTIL HFA;VENTOLIN HFA) 108 (90 BASE) MCG/ACT inhaler Inhale 2 puffs into the lungs every 6 (six) hours as needed for wheezing.  . rizatriptan (MAXALT) 10 MG tablet Take  one tablet at first sign of migraine symptoms; May repeat in 2 hours if no relief **NEEDS FOLLOW UP FOR FURTHER REFILLS**   No current facility-administered medications on file prior to visit.    Review of Systems  Constitutional: Negative for fever, chills, activity change, appetite change, fatigue and unexpected weight change.  HENT: Positive for congestion, postnasal drip, rhinorrhea and sinus pressure. Negative for hearing loss.        Recnet URI  Eyes: Positive for visual disturbance (floaters - seen by eye ).  Respiratory: Positive for cough and wheezing. Negative for chest tightness and shortness of breath.   Cardiovascular: Negative for chest pain, palpitations and leg swelling.  Gastrointestinal: Negative for nausea, vomiting, abdominal pain, diarrhea, constipation, blood in stool and abdominal distention.  Genitourinary: Negative for hematuria and difficulty urinating.  Musculoskeletal: Negative for arthralgias, myalgias and neck pain.  Skin: Negative for rash.  Neurological: Positive for headaches. Negative for dizziness, seizures and syncope.  Hematological: Negative for adenopathy. Does not bruise/bleed easily.  Psychiatric/Behavioral: Negative for dysphoric mood. The patient is not nervous/anxious.    Per HPI unless specifically indicated above    Objective:    BP 104/64  Pulse 70  Temp(Src) 98 F (36.7 C) (Oral)  Ht 5' (1.524 m)  Wt 182 lb 8 oz (82.781 kg)  BMI 35.64 kg/m2  SpO2 97%  Physical Exam  Nursing note and vitals reviewed. Constitutional: She is oriented to person, place, and time. She appears well-developed and well-nourished. No distress.  HENT:  Head: Normocephalic and atraumatic.  Right Ear: Hearing, tympanic membrane, external ear and ear canal normal.  Left Ear: Hearing, tympanic membrane, external ear and ear canal normal.  Nose: Nose normal. No mucosal edema or rhinorrhea. Right sinus exhibits no maxillary sinus tenderness and no frontal sinus  tenderness. Left sinus exhibits no maxillary sinus tenderness and no frontal sinus tenderness.  Mouth/Throat: Uvula is midline, oropharynx is clear and moist and mucous membranes are normal. No oropharyngeal exudate, posterior oropharyngeal edema, posterior oropharyngeal erythema or tonsillar abscesses.  Cerumen R ear canal  Eyes: Conjunctivae and EOM are normal. Pupils are equal, round, and reactive to light. No scleral icterus.  Neck: Normal range of motion. Neck supple.  Cardiovascular: Normal rate, regular rhythm, normal heart sounds and intact distal pulses.   No murmur heard. Pulses:      Radial pulses are 2+ on the right side, and 2+ on the left side.  Pulmonary/Chest: Effort normal and breath sounds normal. No respiratory distress. She has no wheezes. She has no rales.  Abdominal: Soft. She exhibits no distension and no mass. Bowel sounds are increased. There is no hepatosplenomegaly. There is no tenderness. There is no rebound, no guarding and no CVA tenderness.  Musculoskeletal: Normal range of motion. She exhibits no edema.  Lymphadenopathy:    She has no cervical adenopathy.  Neurological: She is alert and oriented to person, place, and time.  CN grossly intact, station and gait intact  Skin: Skin is warm and dry. No rash noted.  Psychiatric: She has a normal mood and affect. Her behavior is normal. Judgment and thought content normal.       Assessment & Plan:   Problem List Items Addressed This Visit   Healthcare maintenance - Primary     Preventative protocols reviewed and updated unless pt declined. Discussed healthy diet and lifestyle. Did not return last year's iFOB - encouraged she return it this year and provided with kit to take home today.    Relevant Orders      CBC with Differential   Migraine headache     Prior on sumatriptan then relpax now maxalt losing effectiveness. Will re-cycle triptans and provided pt with sumatriptan prescription. Does not desire  other administration options like nasal or injectable.    Relevant Medications      SUMAtriptan (IMITREX) tablet   Mild hyperlipidemia     Minimal - anticipate slight TC elevation due to high HDL.    Relevant Orders      Comprehensive metabolic panel      TSH   Obesity     Encouraged continued daily swimming and choosing healthy dietary choices.  Body mass index is 35.64 kg/(m^2).     Relevant Orders      Comprehensive metabolic panel      TSH   Viral URI with cough     Lungs clear today - anticipate viral process and reassured.  Does not desire meds.        Follow up plan: Return in about 1 year (around 02/14/2015), or as needed, for physical.

## 2014-02-13 NOTE — Assessment & Plan Note (Signed)
Lungs clear today - anticipate viral process and reassured.  Does not desire meds.

## 2014-02-13 NOTE — Assessment & Plan Note (Signed)
Encouraged continued daily swimming and choosing healthy dietary choices.  Body mass index is 35.64 kg/(m^2).

## 2014-02-13 NOTE — Progress Notes (Signed)
Pre visit review using our clinic review tool, if applicable. No additional management support is needed unless otherwise documented below in the visit note. 

## 2014-02-13 NOTE — Assessment & Plan Note (Signed)
Minimal - anticipate slight TC elevation due to high HDL.

## 2014-02-13 NOTE — Patient Instructions (Signed)
Stool kit today. Blood work today. Try sumatriptan (imitrex) for migraine prevention (another trial of this medicine). Call your insurance about the shingles shot to see if it is covered or how much it would cost and where is cheaper (here or pharmacy).  If you want to receive here, call for nurse visit. Return as needed or in 1 year for next physical. Good to see you today, call us with questions.

## 2014-02-13 NOTE — Assessment & Plan Note (Signed)
Prior on sumatriptan then relpax now maxalt losing effectiveness. Will re-cycle triptans and provided pt with sumatriptan prescription. Does not desire other administration options like nasal or injectable.

## 2014-02-13 NOTE — Assessment & Plan Note (Addendum)
Preventative protocols reviewed and updated unless pt declined. Discussed healthy diet and lifestyle. Did not return last year's iFOB - encouraged she return it this year and provided with kit to take home today.

## 2014-02-14 ENCOUNTER — Encounter: Payer: Self-pay | Admitting: *Deleted

## 2014-02-14 LAB — COMPREHENSIVE METABOLIC PANEL
ALK PHOS: 71 IU/L (ref 39–117)
ALT: 16 IU/L (ref 0–32)
AST: 21 IU/L (ref 0–40)
Albumin/Globulin Ratio: 2.9 — ABNORMAL HIGH (ref 1.1–2.5)
Albumin: 4.7 g/dL (ref 3.6–4.8)
BILIRUBIN TOTAL: 0.5 mg/dL (ref 0.0–1.2)
BUN / CREAT RATIO: 21 (ref 11–26)
BUN: 16 mg/dL (ref 8–27)
CHLORIDE: 102 mmol/L (ref 97–108)
CO2: 23 mmol/L (ref 18–29)
Calcium: 9.4 mg/dL (ref 8.7–10.3)
Creatinine, Ser: 0.76 mg/dL (ref 0.57–1.00)
GFR calc non Af Amer: 84 mL/min/{1.73_m2} (ref >59)
GFR, EST AFRICAN AMERICAN: 97 mL/min/{1.73_m2} (ref >59)
Globulin, Total: 1.6 g/dL (ref 1.5–4.5)
Glucose: 91 mg/dL (ref 65–99)
Potassium: 5 mmol/L (ref 3.5–5.2)
SODIUM: 144 mmol/L (ref 134–144)
Total Protein: 6.3 g/dL (ref 6.0–8.5)

## 2014-02-14 LAB — CBC WITH DIFFERENTIAL/PLATELET
Basophils Absolute: 0 10*3/uL (ref 0.0–0.2)
Basos: 0 %
Eos: 2 %
Eosinophils Absolute: 0.2 10*3/uL (ref 0.0–0.4)
HCT: 39.2 % (ref 34.0–46.6)
Hemoglobin: 13.8 g/dL (ref 11.1–15.9)
IMMATURE GRANULOCYTES: 0 %
Immature Grans (Abs): 0 10*3/uL (ref 0.0–0.1)
LYMPHS ABS: 2.3 10*3/uL (ref 0.7–3.1)
Lymphs: 27 %
MCH: 31.4 pg (ref 26.6–33.0)
MCHC: 35.2 g/dL (ref 31.5–35.7)
MCV: 89 fL (ref 79–97)
MONOCYTES: 6 %
MONOS ABS: 0.5 10*3/uL (ref 0.1–0.9)
NEUTROS PCT: 65 %
Neutrophils Absolute: 5.5 10*3/uL (ref 1.4–7.0)
RBC: 4.39 x10E6/uL (ref 3.77–5.28)
RDW: 13.5 % (ref 12.3–15.4)
WBC: 8.4 10*3/uL (ref 3.4–10.8)

## 2014-02-14 LAB — TSH: TSH: 1.93 u[IU]/mL (ref 0.450–4.500)

## 2014-05-06 ENCOUNTER — Other Ambulatory Visit: Payer: Self-pay | Admitting: Family Medicine

## 2014-10-27 LAB — HM PAP SMEAR: HM PAP: NORMAL

## 2014-10-27 LAB — HM MAMMOGRAPHY: HM Mammogram: NORMAL

## 2015-02-15 ENCOUNTER — Ambulatory Visit (INDEPENDENT_AMBULATORY_CARE_PROVIDER_SITE_OTHER): Payer: BC Managed Care – PPO | Admitting: Family Medicine

## 2015-02-15 ENCOUNTER — Encounter: Payer: Self-pay | Admitting: Family Medicine

## 2015-02-15 VITALS — BP 126/78 | HR 58 | Temp 98.4°F | Resp 16 | Wt 181.0 lb

## 2015-02-15 DIAGNOSIS — Z91013 Allergy to seafood: Secondary | ICD-10-CM

## 2015-02-15 DIAGNOSIS — E669 Obesity, unspecified: Secondary | ICD-10-CM

## 2015-02-15 DIAGNOSIS — Z0000 Encounter for general adult medical examination without abnormal findings: Secondary | ICD-10-CM

## 2015-02-15 DIAGNOSIS — Z1211 Encounter for screening for malignant neoplasm of colon: Secondary | ICD-10-CM

## 2015-02-15 DIAGNOSIS — E785 Hyperlipidemia, unspecified: Secondary | ICD-10-CM

## 2015-02-15 DIAGNOSIS — G43909 Migraine, unspecified, not intractable, without status migrainosus: Secondary | ICD-10-CM

## 2015-02-15 MED ORDER — RIZATRIPTAN BENZOATE 10 MG PO TABS: ORAL_TABLET | ORAL | Status: DC

## 2015-02-15 MED ORDER — ALBUTEROL SULFATE HFA 108 (90 BASE) MCG/ACT IN AERS: 2.0000 | INHALATION_SPRAY | Freq: Four times a day (QID) | RESPIRATORY_TRACT | Status: DC | PRN

## 2015-02-15 NOTE — Patient Instructions (Addendum)
Return for fasting labs at your convenience. Expect a call to schedule colonoscopy in Summer Call your insurace about the shingles shot to see if it is covered or how much it would cost and where is cheaper (here or pharmacy).  If you want to receive here, call for nurse visit. Return as needed or in 1 year for next physical.

## 2015-02-15 NOTE — Assessment & Plan Note (Signed)
Preventative protocols reviewed and updated unless pt declined. Discussed healthy diet and lifestyle.  

## 2015-02-15 NOTE — Assessment & Plan Note (Addendum)
Discussed healthy diet and lifestyle changes to affect sustainable weight loss. Body mass index is 35.35 kg/(m^2).

## 2015-02-15 NOTE — Assessment & Plan Note (Signed)
Stable on rizatriptan - refilled otday.

## 2015-02-15 NOTE — Progress Notes (Addendum)
BP 126/78 mmHg  Pulse 58  Temp(Src) 98.4 F (36.9 C) (Oral)  Resp 16  Wt 181 lb (82.101 kg)  SpO2 97%   CC: CPE Subjective:    Patient ID: Dana Alvarez, female    DOB: August 30, 1951, 64 y.o.   MRN: [id number]  HPI: Dana Alvarez is a 64 y.o. female presenting on 02/15/2015 for Annual Exam   Currently on gluten and sugar and dairy free diet.  Requests shellfish allergy testing.   Preventative: [facility] Dr. Vernie Ammons for well woman. Done 10/2014. Normal mammo/pap/breast exam this year. Gets every 2 years. Colon cancer screening - requests colonoscopy this summer. Ordered. DEXA next year Flu shot at work.  Tdap 2010.  Discussed shingles shot. Will check with pharmacy. Seat belt use discussed Sunscreen use discussed.  Adopted--part native [place]  Married; lives with husband; dog  3 children  Occupation: school Therapist, sports, prior Licensed conveyancer at [employer], prior Therapist, sports at [employer] Activity: swims daily Diet: good water, fruits/vegetables daily   Relevant past medical, surgical, family and social history reviewed and updated as indicated. Interim medical history since our last visit reviewed. Allergies and medications reviewed and updated. Current Outpatient Prescriptions on File Prior to Visit  Medication Sig  . Multiple Vitamin (MULTIVITAMIN) tablet Take 1 tablet by mouth daily.   No current facility-administered medications on file prior to visit.    Review of Systems  Constitutional: Negative for fever, chills, activity change, appetite change, fatigue and unexpected weight change.  HENT: Negative for hearing loss.   Eyes: Negative for visual disturbance.  Respiratory: Negative for cough, chest tightness, shortness of breath and wheezing.   Cardiovascular: Negative for chest pain, palpitations and leg swelling.  Gastrointestinal: Negative for nausea, vomiting, abdominal pain, diarrhea, constipation, blood in stool and abdominal distention.  Genitourinary:  Negative for hematuria and difficulty urinating.  Musculoskeletal: Negative for myalgias, arthralgias and neck pain.  Skin: Negative for rash.  Neurological: Negative for dizziness, seizures, syncope and headaches.  Hematological: Negative for adenopathy. Does not bruise/bleed easily.  Psychiatric/Behavioral: Negative for dysphoric mood. The patient is not nervous/anxious.    Per HPI unless specifically indicated above     Objective:    BP 126/78 mmHg  Pulse 58  Temp(Src) 98.4 F (36.9 C) (Oral)  Resp 16  Wt 181 lb (82.101 kg)  SpO2 97%  Wt Readings from Last 3 Encounters:  02/15/15 181 lb (82.101 kg)  02/13/14 182 lb 8 oz (82.781 kg)  11/05/12 172 lb 4 oz (78.132 kg)    Physical Exam  Constitutional: She is oriented to person, place, and time. She appears well-developed and well-nourished. No distress.  HENT:  Head: Normocephalic and atraumatic.  Right Ear: Hearing, tympanic membrane, external ear and ear canal normal.  Left Ear: Hearing, tympanic membrane, external ear and ear canal normal.  Nose: Nose normal.  Mouth/Throat: Uvula is midline, oropharynx is clear and moist and mucous membranes are normal. No oropharyngeal exudate, posterior oropharyngeal edema or posterior oropharyngeal erythema.  Eyes: Conjunctivae and EOM are normal. Pupils are equal, round, and reactive to light. No scleral icterus.  Neck: Normal range of motion. Neck supple. No thyromegaly present.  Cardiovascular: Normal rate, regular rhythm, normal heart sounds and intact distal pulses.   No murmur heard. Pulses:      Radial pulses are 2+ on the right side, and 2+ on the left side.  Pulmonary/Chest: Effort normal and breath sounds normal. No respiratory distress. She has no wheezes. She has no  rales.  Breast - deferred (per OBGYN)  Abdominal: Soft. Bowel sounds are normal. She exhibits no distension and no mass. There is no tenderness. There is no rebound and no guarding.  Genitourinary:  GYN -  deferred per OBGYN  Musculoskeletal: Normal range of motion. She exhibits no edema.  Lymphadenopathy:    She has no cervical adenopathy.  Neurological: She is alert and oriented to person, place, and time.  CN grossly intact, station and gait intact  Skin: Skin is warm and dry. No rash noted.  Psychiatric: She has a normal mood and affect. Her behavior is normal. Judgment and thought content normal.  Nursing note and vitals reviewed.       Assessment & Plan:   Problem List Items Addressed This Visit    Shellfish allergy    Requests testing done when she returns for f/u.      Relevant Orders   Allergen Profile, Shellfish (Completed)   Obesity    Discussed healthy diet and lifestyle changes to affect sustainable weight loss. Body mass index is 35.35 kg/(m^2).       Relevant Orders   Comprehensive metabolic panel (Completed)   Mild hyperlipidemia    Will check FLP when returns fasting.      Relevant Orders   Comprehensive metabolic panel (Completed)   Lipid panel (Completed)   Migraine headache    Stable on rizatriptan - refilled otday.      Relevant Medications   rizatriptan (MAXALT) tablet   Healthcare maintenance - Primary    Preventative protocols reviewed and updated unless pt declined. Discussed healthy diet and lifestyle.        Other Visit Diagnoses    Special screening for malignant neoplasms, colon        Relevant Orders    Ambulatory referral to Gastroenterology        Follow up plan: Return in about 1 year (around 02/15/2016), or as needed, for follow up visit.

## 2015-02-15 NOTE — Assessment & Plan Note (Signed)
Requests testing done when she returns for f/u.

## 2015-02-15 NOTE — Assessment & Plan Note (Signed)
Will check FLP when returns fasting.

## 2015-02-20 ENCOUNTER — Other Ambulatory Visit (INDEPENDENT_AMBULATORY_CARE_PROVIDER_SITE_OTHER): Payer: BC Managed Care – PPO

## 2015-02-20 DIAGNOSIS — Z91013 Allergy to seafood: Secondary | ICD-10-CM | POA: Diagnosis not present

## 2015-02-20 DIAGNOSIS — E669 Obesity, unspecified: Secondary | ICD-10-CM

## 2015-02-20 DIAGNOSIS — E785 Hyperlipidemia, unspecified: Secondary | ICD-10-CM

## 2015-02-23 LAB — ALLERGEN PROFILE, SHELLFISH
Clam IgE: <0.1 kU/L
F023-IgE Crab: <0.1 kU/L
Scallop IgE: <0.1 kU/L
Shrimp IgE: 0.19 kU/L — AB

## 2015-02-23 LAB — LIPID PANEL
Chol/HDL Ratio: 3.3 ratio units (ref 0.0–4.4)
Cholesterol, Total: 163 mg/dL (ref 100–199)
HDL: 49 mg/dL (ref >39)
LDL Calculated: 94 mg/dL (ref 0–99)
TRIGLYCERIDES: 102 mg/dL (ref 0–149)
VLDL Cholesterol Cal: 20 mg/dL (ref 5–40)

## 2015-02-23 LAB — COMPREHENSIVE METABOLIC PANEL
A/G RATIO: 2.6 — AB (ref 1.1–2.5)
ALK PHOS: 56 IU/L (ref 39–117)
ALT: 16 IU/L (ref 0–32)
AST: 18 IU/L (ref 0–40)
Albumin: 4.2 g/dL (ref 3.6–4.8)
BILIRUBIN TOTAL: 0.3 mg/dL (ref 0.0–1.2)
BUN / CREAT RATIO: 20 (ref 11–26)
BUN: 15 mg/dL (ref 8–27)
CO2: 23 mmol/L (ref 18–29)
Calcium: 9.1 mg/dL (ref 8.7–10.3)
Chloride: 106 mmol/L (ref 97–108)
Creatinine, Ser: 0.74 mg/dL (ref 0.57–1.00)
GFR calc Af Amer: 99 mL/min/{1.73_m2} (ref >59)
GFR, EST NON AFRICAN AMERICAN: 86 mL/min/{1.73_m2} (ref >59)
Globulin, Total: 1.6 g/dL (ref 1.5–4.5)
Glucose: 107 mg/dL — ABNORMAL HIGH (ref 65–99)
POTASSIUM: 4.5 mmol/L (ref 3.5–5.2)
SODIUM: 144 mmol/L (ref 134–144)
Total Protein: 5.8 g/dL — ABNORMAL LOW (ref 6.0–8.5)

## 2015-02-25 NOTE — Addendum Note (Signed)
Addended by: Ria Bush on: 02/25/2015 12:17 PM   Modules accepted: Miquel Dunn

## 2015-03-22 ENCOUNTER — Encounter: Payer: Self-pay | Admitting: Internal Medicine

## 2015-04-04 ENCOUNTER — Encounter: Payer: Self-pay | Admitting: Internal Medicine

## 2015-05-01 ENCOUNTER — Ambulatory Visit (AMBULATORY_SURGERY_CENTER): Payer: Self-pay

## 2015-05-01 VITALS — Ht 62.0 in | Wt 180.6 lb

## 2015-05-01 DIAGNOSIS — Z1211 Encounter for screening for malignant neoplasm of colon: Secondary | ICD-10-CM

## 2015-05-01 MED ORDER — NA SULFATE-K SULFATE-MG SULF 17.5-3.13-1.6 GM/177ML PO SOLN: ORAL | Status: DC

## 2015-05-01 NOTE — Progress Notes (Signed)
Per pt, no allergies to soy or egg products.Pt not taking any weight loss meds or using  O2 at home.   Per pt,some sedation cause a drop in her B/P!

## 2015-05-02 ENCOUNTER — Encounter: Payer: Self-pay | Admitting: Internal Medicine

## 2015-05-15 ENCOUNTER — Ambulatory Visit (AMBULATORY_SURGERY_CENTER): Payer: BC Managed Care – PPO | Admitting: Internal Medicine

## 2015-05-15 ENCOUNTER — Encounter: Payer: Self-pay | Admitting: Internal Medicine

## 2015-05-15 VITALS — BP 114/71 | HR 54 | Temp 97.4°F | Resp 16 | Ht 62.0 in | Wt 180.0 lb

## 2015-05-15 DIAGNOSIS — D127 Benign neoplasm of rectosigmoid junction: Secondary | ICD-10-CM

## 2015-05-15 DIAGNOSIS — D122 Benign neoplasm of ascending colon: Secondary | ICD-10-CM | POA: Diagnosis not present

## 2015-05-15 DIAGNOSIS — K635 Polyp of colon: Secondary | ICD-10-CM | POA: Diagnosis not present

## 2015-05-15 DIAGNOSIS — Z1211 Encounter for screening for malignant neoplasm of colon: Secondary | ICD-10-CM | POA: Diagnosis not present

## 2015-05-15 MED ORDER — SODIUM CHLORIDE 0.9 % IV SOLN: 500.0000 mL | INTRAVENOUS | Status: DC

## 2015-05-15 NOTE — Patient Instructions (Signed)
YOU HAD AN ENDOSCOPIC PROCEDURE TODAY AT [facility]:   Refer to the procedure report that was given to you for any specific questions about what was found during the examination.  If the procedure report does not answer your questions, please call your gastroenterologist to clarify.  If you requested that your care partner not be given the details of your procedure findings, then the procedure report has been included in a sealed envelope for you to review at your convenience later.  YOU SHOULD EXPECT: Some feelings of bloating in the abdomen. Passage of more gas than usual.  Walking can help get rid of the air that was put into your GI tract during the procedure and reduce the bloating. If you had a lower endoscopy (such as a colonoscopy or flexible sigmoidoscopy) you may notice spotting of blood in your stool or on the toilet paper. If you underwent a bowel prep for your procedure, you may not have a normal bowel movement for a few days.  Please Note:  You might notice some irritation and congestion in your nose or some drainage.  This is from the oxygen used during your procedure.  There is no need for concern and it should clear up in a day or so.  SYMPTOMS TO REPORT IMMEDIATELY:   Following lower endoscopy (colonoscopy or flexible sigmoidoscopy):  Excessive amounts of blood in the stool  Significant tenderness or worsening of abdominal pains  Swelling of the abdomen that is new, acute  Fever of 100F or higher    For urgent or emergent issues, a gastroenterologist can be reached at any hour by calling [phone].   DIET: Your first meal following the procedure should be a small meal and then it is ok to progress to your normal diet. Heavy or fried foods are harder to digest and may make you feel nauseous or bloated.  Likewise, meals heavy in dairy and vegetables can increase bloating.  Drink plenty of fluids but you should avoid alcoholic beverages for 24  hours.  ACTIVITY:  You should plan to take it easy for the rest of today and you should NOT DRIVE or use heavy machinery until tomorrow (because of the sedation medicines used during the test).    FOLLOW UP: Our staff will call the number listed on your records the next business day following your procedure to check on you and address any questions or concerns that you may have regarding the information given to you following your procedure. If we do not reach you, we will leave a message.  However, if you are feeling well and you are not experiencing any problems, there is no need to return our call.  We will assume that you have returned to your regular daily activities without incident.  If any biopsies were taken you will be contacted by phone or by letter within the next 1-3 weeks.  Please call us at [phone] if you have not heard about the biopsies in 3 weeks.    SIGNATURES/CONFIDENTIALITY: You and/or your care partner have signed paperwork which will be entered into your electronic medical record.  These signatures attest to the fact that that the information above on your After Visit Summary has been reviewed and is understood.  Full responsibility of the confidentiality of this discharge information lies with you and/or your care-partner.    Information on polyps & diverticulosis given to you today

## 2015-05-15 NOTE — Progress Notes (Signed)
To recovery, report to [place], RN, VSS 

## 2015-05-15 NOTE — Op Note (Signed)
[place]  [address] [place], [id number]   COLONOSCOPY PROCEDURE REPORT  PATIENT: Dana Alvarez, Dana Alvarez  MR#: [id number] BIRTHDATE: 1951/07/21 , 64  yrs. old GENDER: female ENDOSCOPIST: Jerene Bears, MD REFERRED GY:IRSWNI Danise Mina, M.D. PROCEDURE DATE:  05/15/2015 PROCEDURE:   Colonoscopy, screening and Colonoscopy with snare polypectomy First Screening Colonoscopy - Avg.  risk and is 50 yrs.  old or older Yes.  Prior Negative Screening - Now for repeat screening. N/A  History of Adenoma - Now for follow-up colonoscopy & has been > or = to 3 yrs.  N/A  Polyps removed today? Yes ASA CLASS:   Class II INDICATIONS:Screening for colonic neoplasia, average risk patient for colon cancer, and 1st colonoscopy. MEDICATIONS: Monitored anesthesia care and Propofol 250 mg IV  DESCRIPTION OF PROCEDURE:   After the risks benefits and alternatives of the procedure were thoroughly explained, informed consent was obtained.  The digital rectal exam revealed no rectal mass.   The LB [id number]  endoscope was introduced through the anus and advanced to the cecum, which was identified by both the appendix and ileocecal valve. No adverse events experienced. The quality of the prep was good.  (Suprep was used)  The instrument was then slowly withdrawn as the colon was fully examined. Estimated blood loss is zero unless otherwise noted in this procedure report.  COLON FINDINGS: A flat polyp measuring 9 mm in size with a mucous cap was found in the ascending colon.  A polypectomy was performed with a cold snare.  The resection was complete, the polyp tissue was completely retrieved and sent to histology.   A sessile polyp measuring 4 mm in size was found in the rectosigmoid colon.  A polypectomy was performed with a cold snare.  The resection was complete, the polyp tissue was completely retrieved and sent to histology.   There was mild diverticulosis noted in the  descending colon and sigmoid colon.  Retroflexed views revealed no abnormalities. The time to cecum = 5.1 Withdrawal time = 14.5   The scope was withdrawn and the procedure completed. COMPLICATIONS: There were no immediate complications.  ENDOSCOPIC IMPRESSION: 1.   Flat polyp was found in the ascending colon; polypectomy was performed with a cold snare 2.   Sessile polyp was found in the rectosigmoid colon; polypectomy was performed with a cold snare 3.   Mild diverticulosis was noted in the descending colon and sigmoid colon  RECOMMENDATIONS: 1.  Await pathology results 2.  If the polyps removed today are proven to be adenomatous (pre-cancerous) polyps, you will need a repeat colonoscopy in 5 years.  Otherwise you should continue to follow colorectal cancer screening guidelines for "routine risk" patients with colonoscopy in 10 years.  You will receive a letter within 1-2 weeks with the results of your biopsy as well as final recommendations.  Please call my office if you have not received a letter after 3 weeks.  eSigned:  Jerene Bears, MD 05/15/2015 8:33 AM  cc: Ria Bush MD and The Patient

## 2015-05-15 NOTE — Progress Notes (Signed)
Called to room to assist during endoscopic procedure.  Patient ID and intended procedure confirmed with present staff. Received instructions for my participation in the procedure from the performing physician.  

## 2015-05-16 ENCOUNTER — Telehealth: Payer: Self-pay | Admitting: *Deleted

## 2015-05-16 NOTE — Telephone Encounter (Signed)
No answer, left message to call if questions or concerns. 

## 2015-05-24 ENCOUNTER — Encounter: Payer: Self-pay | Admitting: Internal Medicine

## 2015-05-25 HISTORY — PX: COLONOSCOPY: SHX174

## 2015-05-26 ENCOUNTER — Encounter: Payer: Self-pay | Admitting: Family Medicine

## 2016-02-11 ENCOUNTER — Other Ambulatory Visit: Payer: Self-pay | Admitting: Family Medicine

## 2016-02-11 DIAGNOSIS — E785 Hyperlipidemia, unspecified: Secondary | ICD-10-CM

## 2016-02-11 DIAGNOSIS — E669 Obesity, unspecified: Secondary | ICD-10-CM

## 2016-02-13 ENCOUNTER — Other Ambulatory Visit (INDEPENDENT_AMBULATORY_CARE_PROVIDER_SITE_OTHER): Payer: BC Managed Care – PPO

## 2016-02-13 DIAGNOSIS — E785 Hyperlipidemia, unspecified: Secondary | ICD-10-CM | POA: Diagnosis not present

## 2016-02-14 LAB — BASIC METABOLIC PANEL
BUN/Creatinine Ratio: 21 (ref 11–26)
BUN: 16 mg/dL (ref 8–27)
CALCIUM: 9.3 mg/dL (ref 8.7–10.3)
CHLORIDE: 102 mmol/L (ref 96–106)
CO2: 24 mmol/L (ref 18–29)
Creatinine, Ser: 0.77 mg/dL (ref 0.57–1.00)
GFR calc Af Amer: 94 mL/min/{1.73_m2} (ref >59)
GFR calc non Af Amer: 81 mL/min/{1.73_m2} (ref >59)
Glucose: 107 mg/dL — ABNORMAL HIGH (ref 65–99)
POTASSIUM: 5 mmol/L (ref 3.5–5.2)
SODIUM: 144 mmol/L (ref 134–144)

## 2016-02-14 LAB — LIPID PANEL
CHOL/HDL RATIO: 2.6 ratio (ref 0.0–4.4)
Cholesterol, Total: 197 mg/dL (ref 100–199)
HDL: 76 mg/dL (ref >39)
LDL CALC: 110 mg/dL — AB (ref 0–99)
Triglycerides: 56 mg/dL (ref 0–149)
VLDL Cholesterol Cal: 11 mg/dL (ref 5–40)

## 2016-02-14 LAB — TSH: TSH: 3.27 u[IU]/mL (ref 0.450–4.500)

## 2016-02-15 ENCOUNTER — Encounter: Payer: BC Managed Care – PPO | Admitting: Family Medicine

## 2016-02-20 ENCOUNTER — Ambulatory Visit (INDEPENDENT_AMBULATORY_CARE_PROVIDER_SITE_OTHER): Payer: BC Managed Care – PPO | Admitting: Family Medicine

## 2016-02-20 ENCOUNTER — Encounter: Payer: Self-pay | Admitting: Family Medicine

## 2016-02-20 VITALS — BP 110/80 | HR 76 | Temp 98.2°F | Wt 179.2 lb

## 2016-02-20 DIAGNOSIS — Z23 Encounter for immunization: Secondary | ICD-10-CM

## 2016-02-20 DIAGNOSIS — E669 Obesity, unspecified: Secondary | ICD-10-CM | POA: Diagnosis not present

## 2016-02-20 DIAGNOSIS — E785 Hyperlipidemia, unspecified: Secondary | ICD-10-CM | POA: Diagnosis not present

## 2016-02-20 DIAGNOSIS — E2839 Other primary ovarian failure: Secondary | ICD-10-CM

## 2016-02-20 DIAGNOSIS — Z0000 Encounter for general adult medical examination without abnormal findings: Secondary | ICD-10-CM

## 2016-02-20 DIAGNOSIS — Z7189 Other specified counseling: Secondary | ICD-10-CM

## 2016-02-20 NOTE — Assessment & Plan Note (Signed)
Discussed healthy diet and lifestyle changes to affect sustainable weight loss  

## 2016-02-20 NOTE — Progress Notes (Signed)
BP 110/80 mmHg  Pulse 76  Temp(Src) 98.2 F (36.8 C) (Oral)  Wt 179 lb 4 oz (81.307 kg)   CC: welcome to medicare visit  Subjective:    Patient ID: Dana Alvarez, female    DOB: 25-Aug-1951, 65 y.o.   MRN: [id number]  HPI: Dana Alvarez is a 65 y.o. female presenting on 02/20/2016 for Annual Exam   Has tried gluten and sugar and dairy free diet.  Currently on 1200-1500 cal/day.  Swims 50 min daily.   Toenail fungus Migraines doing well with maxalt.   Preventative: [facility] Dr. Vernie Ammons for well woman. Normal mammo/pap/breast exam this year. Gets every 2 years. COLONOSCOPY Date: 05/2015 2 polyps, mild diverticulosis, rpt 5 yrs ([facility]) DEXA due this year.  Flu shot yearly. Tdap 2012 prevnar today zostavax - needs to check with insurance about this Advanced planning - does not have set up. Wants to talk with lawyer about this. Declines packet today Seat belt use discussed Sunscreen use discussed. No changing moles on skin  Adopted--part native [place]  Married; lives with husband; dog  3 children  Occupation: school Therapist, sports, prior Licensed conveyancer at [employer], prior Therapist, sports at [employer] Activity: swims daily Diet: good water, fruits/vegetables daily   Relevant past medical, surgical, family and social history reviewed and updated as indicated. Interim medical history since our last visit reviewed. Allergies and medications reviewed and updated. Current Outpatient Prescriptions on File Prior to Visit  Medication Sig  . albuterol (PROVENTIL HFA;VENTOLIN HFA) 108 (90 BASE) MCG/ACT inhaler Inhale 2 puffs into the lungs every 6 (six) hours as needed for wheezing.  . Multiple Vitamin (MULTIVITAMIN) tablet Take 1 tablet by mouth daily.  . Probiotic Product (PROBIOTIC DAILY PO) Take by mouth. Probiotic gummy -Take one daily  . rizatriptan (MAXALT) 10 MG tablet TAKE ONE TABLET AT FIRST SIGN OF MIGRAINE SYMPTOMS. MAY REPEATIN 2 HOURS IF NO RELIEF.   No current  facility-administered medications on file prior to visit.    Review of Systems  Constitutional: Negative for fever, chills, activity change, appetite change, fatigue and unexpected weight change.  HENT: Negative for hearing loss.   Eyes: Negative for visual disturbance.  Respiratory: Negative for cough, chest tightness, shortness of breath and wheezing.   Cardiovascular: Negative for chest pain, palpitations and leg swelling.  Gastrointestinal: Negative for nausea, vomiting, abdominal pain, diarrhea, constipation, blood in stool and abdominal distention.  Genitourinary: Negative for hematuria and difficulty urinating.  Musculoskeletal: Negative for myalgias, arthralgias and neck pain.  Skin: Negative for rash.  Neurological: Positive for headaches (occasional migraines). Negative for dizziness, seizures and syncope.  Hematological: Negative for adenopathy. Does not bruise/bleed easily.  Psychiatric/Behavioral: Negative for dysphoric mood. The patient is not nervous/anxious.    Per HPI unless specifically indicated in ROS section     Objective:    BP 110/80 mmHg  Pulse 76  Temp(Src) 98.2 F (36.8 C) (Oral)  Wt 179 lb 4 oz (81.307 kg)  Wt Readings from Last 3 Encounters:  02/20/16 179 lb 4 oz (81.307 kg)  05/15/15 180 lb (81.647 kg)  05/01/15 180 lb 9.6 oz (81.92 kg)   Body mass index is 32.78 kg/(m^2).  Physical Exam  Constitutional: She is oriented to person, place, and time. She appears well-developed and well-nourished. No distress.  HENT:  Head: Normocephalic and atraumatic.  Right Ear: Hearing, tympanic membrane, external ear and ear canal normal.  Left Ear: Hearing, tympanic membrane, external ear and ear canal normal.  Nose: Nose  normal.  Mouth/Throat: Uvula is midline, oropharynx is clear and moist and mucous membranes are normal. No oropharyngeal exudate, posterior oropharyngeal edema or posterior oropharyngeal erythema.  Eyes: Conjunctivae and EOM are normal. Pupils  are equal, round, and reactive to light. No scleral icterus.  Neck: Normal range of motion. Neck supple. Carotid bruit is not present. No thyromegaly present.  Cardiovascular: Normal rate, regular rhythm, normal heart sounds and intact distal pulses.   No murmur heard. Pulses:      Radial pulses are 2+ on the right side, and 2+ on the left side.  Pulmonary/Chest: Effort normal and breath sounds normal. No respiratory distress. She has no wheezes. She has no rales.  Abdominal: Soft. Bowel sounds are normal. She exhibits no distension and no mass. There is no tenderness. There is no rebound and no guarding.  Musculoskeletal: Normal range of motion. She exhibits no edema.  Lymphadenopathy:    She has no cervical adenopathy.  Neurological: She is alert and oriented to person, place, and time.  CN grossly intact, station and gait intact  Skin: Skin is warm and dry. No rash noted.  Psychiatric: She has a normal mood and affect. Her behavior is normal. Judgment and thought content normal.  Nursing note and vitals reviewed.  Results for orders placed or performed in visit on 02/13/16  Lipid panel  Result Value Ref Range   Cholesterol, Total 197 100 - 199 mg/dL   Triglycerides 56 0 - 149 mg/dL   HDL 76 >39 mg/dL   VLDL Cholesterol Cal 11 5 - 40 mg/dL   LDL Calculated 110 (H) 0 - 99 mg/dL   Chol/HDL Ratio 2.6 0.0 - 4.4 ratio units  TSH  Result Value Ref Range   TSH 3.270 0.450 - 4.500 uIU/mL  Basic metabolic panel  Result Value Ref Range   Glucose 107 (H) 65 - 99 mg/dL   BUN 16 8 - 27 mg/dL   Creatinine, Ser 0.77 0.57 - 1.00 mg/dL   GFR calc non Af Amer 81 >59 mL/min/1.73   GFR calc Af Amer 94 >59 mL/min/1.73   BUN/Creatinine Ratio 21 11 - 26   Sodium 144 134 - 144 mmol/L   Potassium 5.0 3.5 - 5.2 mmol/L   Chloride 102 96 - 106 mmol/L   CO2 24 18 - 29 mmol/L   Calcium 9.3 8.7 - 10.3 mg/dL      Assessment & Plan:   Problem List Items Addressed This Visit    Healthcare maintenance  - Primary    Preventative protocols reviewed and updated unless pt declined. Discussed healthy diet and lifestyle.       Mild hyperlipidemia    Stable off meds.      Obesity, Class I, BMI 30-34.9    Discussed healthy diet and lifestyle changes to affect sustainable weight loss.      Advanced care planning/counseling discussion     Advanced planning - does not have set up. Wants to talk with lawyer about this. Declines packet today       Other Visit Diagnoses    Estrogen deficiency        Relevant Orders    DG Bone Density        Follow up plan: Return in about 1 year (around 02/19/2017) for annual exam, prior fasting for blood work.

## 2016-02-20 NOTE — Progress Notes (Signed)
Pre visit review using our clinic review tool, if applicable. No additional management support is needed unless otherwise documented below in the visit note. 

## 2016-02-20 NOTE — Patient Instructions (Addendum)
Try fungi-nail nail laquer daily for 1 year.  We will call you to schedule bone density scan. Prevnar today.  Work on advanced directive Return as needed or in 1 year for next physical.  Health Maintenance, Female Adopting a healthy lifestyle and getting preventive care can go a long way to promote health and wellness. Talk with your health care provider about what schedule of regular examinations is right for you. This is a good chance for you to check in with your provider about disease prevention and staying healthy. In between checkups, there are plenty of things you can do on your own. Experts have done a lot of research about which lifestyle changes and preventive measures are most likely to keep you healthy. Ask your health care provider for more information. WEIGHT AND DIET  Eat a healthy diet  Be sure to include plenty of vegetables, fruits, low-fat dairy products, and lean protein.  Do not eat a lot of foods high in solid fats, added sugars, or salt.  Get regular exercise. This is one of the most important things you can do for your health.  Most adults should exercise for at least 150 minutes each week. The exercise should increase your heart rate and make you sweat (moderate-intensity exercise).  Most adults should also do strengthening exercises at least twice a week. This is in addition to the moderate-intensity exercise.  Maintain a healthy weight  Body mass index (BMI) is a measurement that can be used to identify possible weight problems. It estimates body fat based on height and weight. Your health care provider can help determine your BMI and help you achieve or maintain a healthy weight.  For females 65 years of age and older:   A BMI below 18.5 is considered underweight.  A BMI of 18.5 to 24.9 is normal.  A BMI of 25 to 29.9 is considered overweight.  A BMI of 30 and above is considered obese.  Watch levels of cholesterol and blood lipids  You should start  having your blood tested for lipids and cholesterol at 65 years of age, then have this test every 5 years.  You may need to have your cholesterol levels checked more often if:  Your lipid or cholesterol levels are high.  You are older than 65 years of age.  You are at high risk for heart disease.  CANCER SCREENING   Lung Cancer  Lung cancer screening is recommended for adults 65-13 years old who are at high risk for lung cancer because of a history of smoking.  A yearly low-dose CT scan of the lungs is recommended for people who:  Currently smoke.  Have quit within the past 15 years.  Have at least a 30-pack-year history of smoking. A pack year is smoking an average of one pack of cigarettes a day for 1 year.  Yearly screening should continue until it has been 15 years since you quit.  Yearly screening should stop if you develop a health problem that would prevent you from having lung cancer treatment.  Breast Cancer  Practice breast self-awareness. This means understanding how your breasts normally appear and feel.  It also means doing regular breast self-exams. Let your health care provider know about any changes, no matter how small.  If you are in your 20s or 30s, you should have a clinical breast exam (CBE) by a health care provider every 1-3 years as part of a regular health exam.  If you are 40 or older,  have a CBE every year. Also consider having a breast X-ray (mammogram) every year.  If you have a family history of breast cancer, talk to your health care provider about genetic screening.  If you are at high risk for breast cancer, talk to your health care provider about having an MRI and a mammogram every year.  Breast cancer gene (BRCA) assessment is recommended for women who have family members with BRCA-related cancers. BRCA-related cancers include:  Breast.  Ovarian.  Tubal.  Peritoneal cancers.  Results of the assessment will determine the need for  genetic counseling and BRCA1 and BRCA2 testing. Cervical Cancer Your health care provider may recommend that you be screened regularly for cancer of the pelvic organs (ovaries, uterus, and vagina). This screening involves a pelvic examination, including checking for microscopic changes to the surface of your cervix (Pap test). You may be encouraged to have this screening done every 3 years, beginning at age 40.  For women ages 23-65, health care providers may recommend pelvic exams and Pap testing every 3 years, or they may recommend the Pap and pelvic exam, combined with testing for human papilloma virus (HPV), every 5 years. Some types of HPV increase your risk of cervical cancer. Testing for HPV may also be done on women of any age with unclear Pap test results.  Other health care providers may not recommend any screening for nonpregnant women who are considered low risk for pelvic cancer and who do not have symptoms. Ask your health care provider if a screening pelvic exam is right for you.  If you have had past treatment for cervical cancer or a condition that could lead to cancer, you need Pap tests and screening for cancer for at least 20 years after your treatment. If Pap tests have been discontinued, your risk factors (such as having a new sexual partner) need to be reassessed to determine if screening should resume. Some women have medical problems that increase the chance of getting cervical cancer. In these cases, your health care provider may recommend more frequent screening and Pap tests. Colorectal Cancer  This type of cancer can be detected and often prevented.  Routine colorectal cancer screening usually begins at 65 years of age and continues through 65 years of age.  Your health care provider may recommend screening at an earlier age if you have risk factors for colon cancer.  Your health care provider may also recommend using home test kits to check for hidden blood in the  stool.  A small camera at the end of a tube can be used to examine your colon directly (sigmoidoscopy or colonoscopy). This is done to check for the earliest forms of colorectal cancer.  Routine screening usually begins at age 49.  Direct examination of the colon should be repeated every 5-10 years through 65 years of age. However, you may need to be screened more often if early forms of precancerous polyps or small growths are found. Skin Cancer  Check your skin from head to toe regularly.  Tell your health care provider about any new moles or changes in moles, especially if there is a change in a mole's shape or color.  Also tell your health care provider if you have a mole that is larger than the size of a pencil eraser.  Always use sunscreen. Apply sunscreen liberally and repeatedly throughout the day.  Protect yourself by wearing long sleeves, pants, a wide-brimmed hat, and sunglasses whenever you are outside. HEART DISEASE, DIABETES, AND  HIGH BLOOD PRESSURE   High blood pressure causes heart disease and increases the risk of stroke. High blood pressure is more likely to develop in:  People who have blood pressure in the high end of the normal range (130-139/85-89 mm Hg).  People who are overweight or obese.  People who are African American.  If you are 82-58 years of age, have your blood pressure checked every 3-5 years. If you are 8 years of age or older, have your blood pressure checked every year. You should have your blood pressure measured twice--once when you are at a hospital or clinic, and once when you are not at a hospital or clinic. Record the average of the two measurements. To check your blood pressure when you are not at a hospital or clinic, you can use:  An automated blood pressure machine at a pharmacy.  A home blood pressure monitor.  If you are between 75 years and 23 years old, ask your health care provider if you should take aspirin to prevent  strokes.  Have regular diabetes screenings. This involves taking a blood sample to check your fasting blood sugar level.  If you are at a normal weight and have a low risk for diabetes, have this test once every three years after 65 years of age.  If you are overweight and have a high risk for diabetes, consider being tested at a younger age or more often. PREVENTING INFECTION  Hepatitis B  If you have a higher risk for hepatitis B, you should be screened for this virus. You are considered at high risk for hepatitis B if:  You were born in a country where hepatitis B is common. Ask your health care provider which countries are considered high risk.  Your parents were born in a high-risk country, and you have not been immunized against hepatitis B (hepatitis B vaccine).  You have HIV or AIDS.  You use needles to inject street drugs.  You live with someone who has hepatitis B.  You have had sex with someone who has hepatitis B.  You get hemodialysis treatment.  You take certain medicines for conditions, including cancer, organ transplantation, and autoimmune conditions. Hepatitis C  Blood testing is recommended for:  Everyone born from [birth year] through 1965.  Anyone with known risk factors for hepatitis C. Sexually transmitted infections (STIs)  You should be screened for sexually transmitted infections (STIs) including gonorrhea and chlamydia if:  You are sexually active and are younger than 65 years of age.  You are older than 65 years of age and your health care provider tells you that you are at risk for this type of infection.  Your sexual activity has changed since you were last screened and you are at an increased risk for chlamydia or gonorrhea. Ask your health care provider if you are at risk.  If you do not have HIV, but are at risk, it may be recommended that you take a prescription medicine daily to prevent HIV infection. This is called pre-exposure prophylaxis  (PrEP). You are considered at risk if:  You are sexually active and do not regularly use condoms or know the HIV status of your partner(s).  You take drugs by injection.  You are sexually active with a partner who has HIV. Talk with your health care provider about whether you are at high risk of being infected with HIV. If you choose to begin PrEP, you should first be tested for HIV. You should then be tested  every 3 months for as long as you are taking PrEP.  PREGNANCY   If you are premenopausal and you may become pregnant, ask your health care provider about preconception counseling.  If you may become pregnant, take 400 to 800 micrograms (mcg) of folic acid every day.  If you want to prevent pregnancy, talk to your health care provider about birth control (contraception). OSTEOPOROSIS AND MENOPAUSE   Osteoporosis is a disease in which the bones lose minerals and strength with aging. This can result in serious bone fractures. Your risk for osteoporosis can be identified using a bone density scan.  If you are 58 years of age or older, or if you are at risk for osteoporosis and fractures, ask your health care provider if you should be screened.  Ask your health care provider whether you should take a calcium or vitamin D supplement to lower your risk for osteoporosis.  Menopause may have certain physical symptoms and risks.  Hormone replacement therapy may reduce some of these symptoms and risks. Talk to your health care provider about whether hormone replacement therapy is right for you.  HOME CARE INSTRUCTIONS   Schedule regular health, dental, and eye exams.  Stay current with your immunizations.   Do not use any tobacco products including cigarettes, chewing tobacco, or electronic cigarettes.  If you are pregnant, do not drink alcohol.  If you are breastfeeding, limit how much and how often you drink alcohol.  Limit alcohol intake to no more than 1 drink per day for  nonpregnant women. One drink equals 12 ounces of beer, 5 ounces of wine, or 1 ounces of hard liquor.  Do not use street drugs.  Do not share needles.  Ask your health care provider for help if you need support or information about quitting drugs.  Tell your health care provider if you often feel depressed.  Tell your health care provider if you have ever been abused or do not feel safe at home.   This information is not intended to replace advice given to you by your health care provider. Make sure you discuss any questions you have with your health care provider.   Document Released: 05/26/2011 Document Revised: 12/01/2014 Document Reviewed: 10/12/2013 Elsevier Interactive Patient Education [employer].

## 2016-02-20 NOTE — Assessment & Plan Note (Signed)
Stable off meds. ?

## 2016-02-20 NOTE — Addendum Note (Signed)
Addended by: Royann Shivers A on: 02/20/2016 02:19 PM   Modules accepted: Orders

## 2016-02-20 NOTE — Assessment & Plan Note (Signed)
Advanced planning - does not have set up. Wants to talk with lawyer about this. Declines packet today

## 2016-02-20 NOTE — Assessment & Plan Note (Signed)
Preventative protocols reviewed and updated unless pt declined. Discussed healthy diet and lifestyle.  

## 2016-02-23 HISTORY — PX: OTHER SURGICAL HISTORY: SHX169

## 2016-03-05 ENCOUNTER — Telehealth: Payer: Self-pay

## 2016-03-05 ENCOUNTER — Other Ambulatory Visit: Payer: Self-pay | Admitting: Family Medicine

## 2016-03-05 NOTE — Telephone Encounter (Signed)
Dana Alvarez [phone]  [employer] dropped off a Disabilty Parking Placard to be filled out. Call when ready. -- Placed in RX Box up front.

## 2016-03-05 NOTE — Telephone Encounter (Signed)
Pt was seen for annual exam on 02/20/16; refilled ventolin and maxalt per protocol. Pt notified and will ck with pharmacy; going out of town in AM.

## 2016-03-06 ENCOUNTER — Encounter: Payer: Self-pay | Admitting: Family Medicine

## 2016-03-06 NOTE — Telephone Encounter (Signed)
In your IN box for completion.  

## 2016-03-10 NOTE — Telephone Encounter (Signed)
PT AWARE  

## 2016-03-10 NOTE — Telephone Encounter (Signed)
Filled and in [facility]' box. Indication: cannot walk 200 ft without stopping to rest, severely limited due to arthritic or orthopedic condition

## 2016-03-10 NOTE — Telephone Encounter (Signed)
Left message on voice mail that form was ready for pickup up front

## 2016-05-24 LAB — HM MAMMOGRAPHY: HM Mammogram: NORMAL (ref 0–4)

## 2016-05-24 LAB — HM PAP SMEAR: HM Pap smear: NORMAL

## 2016-08-11 ENCOUNTER — Encounter: Payer: Self-pay | Admitting: Podiatry

## 2016-08-11 ENCOUNTER — Ambulatory Visit (INDEPENDENT_AMBULATORY_CARE_PROVIDER_SITE_OTHER): Payer: BC Managed Care – PPO

## 2016-08-11 ENCOUNTER — Ambulatory Visit (INDEPENDENT_AMBULATORY_CARE_PROVIDER_SITE_OTHER): Payer: BC Managed Care – PPO | Admitting: Podiatry

## 2016-08-11 DIAGNOSIS — M2010 Hallux valgus (acquired), unspecified foot: Secondary | ICD-10-CM

## 2016-08-11 DIAGNOSIS — M2041 Other hammer toe(s) (acquired), right foot: Secondary | ICD-10-CM | POA: Diagnosis not present

## 2016-08-11 DIAGNOSIS — L603 Nail dystrophy: Secondary | ICD-10-CM

## 2016-08-11 DIAGNOSIS — M2011 Hallux valgus (acquired), right foot: Secondary | ICD-10-CM | POA: Diagnosis not present

## 2016-08-11 NOTE — Progress Notes (Signed)
Subjective:    Patient ID: Dana Alvarez, female    DOB: [birth year], 65 y.o.   MRN: [id number]  HPI: She presents today as a 65 year old female with a chief complaint of a painful right foot. She states it is been bothersome for many years but she is getting to the point where is hard to find shoe gear and her foot just hurts constantly. She states that it bothers her right ear as she points to the first metatarsophalangeal joint the dorsal aspect of the second metatarsal middle cuneiform joint as well as the second digit of the right foot which demonstrates a hammertoe deformity. She states that she would like to have this surgically corrected. 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 She has a left hip and needs replacing now.    Review of Systems  Musculoskeletal: Positive for joint swelling.       Joint pain   Allergic/Immunologic: Positive for food allergies.  All other systems reviewed and are negative.      Objective:   Physical Exam: I have reviewed her past mental history medications allergy surgery social history and review of systems. Pulses are strongly palpable. Neurologic sensorium is intact per Semmes-Weinstein monofilament and deep tendon reflexes are intact and brisk bilateral. Muscle strength is present +5/5 dorsiflexors plantar flexors and inverters and everters all intrinsic musculature is intact. Mild pes planus is noted on orthopedic evaluation of the mat all joints distal to the ankle, full range of motion without crepitation with the exception of the first metatarsophalangeal joint and the second PIPJ which is rigid. She has a nonreducible bunion deformity of the right foot which appears to be tracking/track bound. Radiographs taken today 3 views of the right foot demonstrates an osseously mature individual with significant increase in the first inner metatarsal  angle and a dislocation of the first metatarsophalangeal joint. An elongated second metatarsal is resulted in a hammertoe deformity and dislocation of that joint as well. The increase in the first intermetatarsal angle was greater than 20 with a hallux abductus angle similarly. Cutaneous evaluation demonstrates mild reactive hyperkeratoses. She also has small dorsal spur secondary to osteoarthritic change at the base of the second metatarsal and the medial cuneiform joint. No open lesions or wounds are noted. She does have thick yellow dystrophic onychomycotic nails left foot but not on the right.        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 Plantarflexed elongated second metatarsal right foot hammertoe deformity rigid PIPJ right foot. Thick dystrophic mycotic nails left foot. Dorsal spur right midfoot.  [place]: Discussed etiology pathology conservative versus surgical therapies. At this point we consented her today for a Lapidus procedure with screw fixation and bunion repair right foot, second metatarsal osteotomy with double screw fixation right foot, hammertoe repair with pin or screw right foot, dorsal tarsal exostectomy right foot, and application of a below-knee cast. I discussed the surgical plans with her in great detail today she understands this and is amenable to it we did discuss the possible postop complications which may include but are not limited to postop pain bleeding swelling infection recurrence need further surgery overcorrection under correction loss of digit loss of limb loss of Y. Loss of sensation development of a pulmonary embolism and death. I took samples of the nails today to be sent for pathologic evaluation  and I will follow-up with her after her surgery to discuss findings of her nail and treatment.

## 2016-08-11 NOTE — Patient Instructions (Signed)
Pre-Operative Instructions  Congratulations, you have decided to take an important step to improving your quality of life.  You can be assured that the doctors of [facility] will be with you every step of the way.  1. Plan to be at the surgery center/hospital at least 1 (one) hour prior to your scheduled time unless otherwise directed by the surgical center/hospital staff.  You must have a responsible adult accompany you, remain during the surgery and drive you home.  Make sure you have directions to the surgical center/hospital and know how to get there on time. 2. For hospital based surgery you will need to obtain a history and physical form from your family physician within 1 month prior to the date of surgery- we will give you a form for you primary physician.  3. We make every effort to accommodate the date you request for surgery.  There are however, times where surgery dates or times have to be moved.  We will contact you as soon as possible if a change in schedule is required.   4. No Aspirin/Ibuprofen for one week before surgery.  If you are on aspirin, any non-steroidal anti-inflammatory medications (Mobic, Aleve, Ibuprofen) you should stop taking it 7 days prior to your surgery.  You make take Tylenol  For pain prior to surgery.  5. Medications- If you are taking daily heart and blood pressure medications, seizure, reflux, allergy, asthma, anxiety, pain or diabetes medications, make sure the surgery center/hospital is aware before the day of surgery so they may notify you which medications to take or avoid the day of surgery. 6. No food or drink after midnight the night before surgery unless directed otherwise by surgical center/hospital staff. 7. No alcoholic beverages 24 hours prior to surgery.  No smoking 24 hours prior to or 24 hours after surgery. 8. Wear loose pants or shorts- loose enough to fit over bandages, boots, and casts. 9. No slip on shoes, sneakers are best. 10. Bring  your boot with you to the surgery center/hospital.  Also bring crutches or a walker if your physician has prescribed it for you.  If you do not have this equipment, it will be provided for you after surgery. 11. If you have not been contracted by the surgery center/hospital by the day before your surgery, call to confirm the date and time of your surgery. 12. Leave-time from work may vary depending on the type of surgery you have.  Appropriate arrangements should be made prior to surgery with your employer. 13. Prescriptions will be provided immediately following surgery by your doctor.  Have these filled as soon as possible after surgery and take the medication as directed. 14. Remove nail polish on the operative foot. 15. Wash the night before surgery.  The night before surgery wash the foot and leg well with the antibacterial soap provided and water paying special attention to beneath the toenails and in between the toes.  Rinse thoroughly with water and dry well with a towel.  Perform this wash unless told not to do so by your physician.  Enclosed: 1 Ice pack (please put in freezer the night before surgery)   1 Hibiclens skin cleaner   Pre-op Instructions  If you have any questions regarding the instructions, do not hesitate to call our office.  [place]: [address] [place], [place] [address] [phone]  [place]: [address] , [place] [address] [phone]  [place]: [address]  [place], [place] [address] [phone]   Dr.   Norman Regal DPM, Dr. Matthew Wagoner DPM, Dr. M. Todd Hyatt DPM, Dr. Titorya Stover DPM 

## 2016-09-24 ENCOUNTER — Telehealth: Payer: Self-pay | Admitting: *Deleted

## 2016-09-24 NOTE — Telephone Encounter (Signed)
"  I'm returning a phone call you made to me this morning.  Thank you."  I am returning your call.  You want to schedule surgery?  Do you have a date in mind?  "Yes, I would like to schedule surgery.  How much will I have to pay?  I have a flexible spending that I can use.  There's a $1000 on it."  I will have to get Jocelyn Lamer to give you a call back with an estimate.  "Okay sounds great.  I'd like to do surgery at the end of December."  He can do it on December 29.  "Do you have any other time that week?"  He can do it on December 28 but it will be in the afternoon.  No, I would rather have it done in the morning."  Okay, I will get Jocelyn Lamer to give you a call with an estimate.  "Thank you so much."

## 2016-09-24 NOTE — Telephone Encounter (Signed)
Per Levada Dy, patient called to schedule surgery.  She wants to have it done before the end of the year.    I attempted to return her call.  I left messages for her to call me back.

## 2016-10-14 ENCOUNTER — Telehealth: Payer: Self-pay | Admitting: Podiatry

## 2016-10-14 NOTE — Telephone Encounter (Signed)
Pt came in wanting to know about what her results where for her culture and follow up instructions no one every called her about it. And she also wants too know did she need to come in before the 29th (day of surgery) to be seen she is stating that her right heel is in pain.

## 2016-10-22 ENCOUNTER — Ambulatory Visit (INDEPENDENT_AMBULATORY_CARE_PROVIDER_SITE_OTHER): Payer: BC Managed Care – PPO | Admitting: Podiatry

## 2016-10-22 ENCOUNTER — Encounter: Payer: Self-pay | Admitting: Podiatry

## 2016-10-22 DIAGNOSIS — M722 Plantar fascial fibromatosis: Secondary | ICD-10-CM

## 2016-10-22 NOTE — Progress Notes (Signed)
She presents today concerned about pain to her right heel. She states that she's had excruciating pain to the right heel times the past 8 weeks. She states that now it seems to be getting better and she comes in to our office. She is also concerned as to whether or not we need to add this to our surgery well procedures. She like to have everything done on this foot if possible. She also is questioning fungal results.  Objective: Vital signs are stable she is alert and oriented 3. Pulses are palpable. She has no reproducible pain on palpation of the right heel. Severe hallux valgus deformity hammertoe deformities are noted. Fungal culture does demonstrate onychomycosis.  Assessment: Onychomycosis probable plantar fasciitis severe HAV deformity and hammertoe deformity.  Plan: At this point oral therapy is what she is requesting I recommended that we not start this until after surgery. She understands this and is amenable to it. I did explain to her that I would be happy to inject her right heel while she is asleep for surgery. I will follow-up with her in December for surgery.

## 2016-11-20 ENCOUNTER — Other Ambulatory Visit: Payer: Self-pay | Admitting: Podiatry

## 2016-11-20 ENCOUNTER — Telehealth: Payer: Self-pay | Admitting: *Deleted

## 2016-11-20 MED ORDER — HYDROMORPHONE HCL 4 MG PO TABS: 4.0000 mg | ORAL_TABLET | ORAL | 0 refills | Status: DC | PRN

## 2016-11-20 MED ORDER — ONDANSETRON HCL 4 MG PO TABS: 4.0000 mg | ORAL_TABLET | Freq: Three times a day (TID) | ORAL | 0 refills | Status: DC | PRN

## 2016-11-20 MED ORDER — CLINDAMYCIN HCL 150 MG PO CAPS: 150.0000 mg | ORAL_CAPSULE | Freq: Three times a day (TID) | ORAL | 0 refills | Status: DC

## 2016-11-20 NOTE — Telephone Encounter (Signed)
"  My husband has mistakenly erased the message you left for me, something about a form.  Please give me a call.  I can come by the office to get another form or go by the surgical center to pick up another form."  I am calling to see if you were able to register with the surgical center.  "Yes, I got everything done.  I do have a question.  It's okay for me to drink coffee or tea with no creamer or anything like that in my coffee the 2 hours before isn't it?"  I recommend that you not eat or drink anything before your surgery.  "My instructions say it's okay."  I suggest that you not do it.  "Well that's what the instructions say."  I only recommend you take sips to take medication.

## 2016-11-21 ENCOUNTER — Encounter: Payer: Self-pay | Admitting: Podiatry

## 2016-11-21 DIAGNOSIS — M21541 Acquired clubfoot, right foot: Secondary | ICD-10-CM

## 2016-11-21 DIAGNOSIS — M2011 Hallux valgus (acquired), right foot: Secondary | ICD-10-CM

## 2016-11-21 DIAGNOSIS — M2041 Other hammer toe(s) (acquired), right foot: Secondary | ICD-10-CM

## 2016-11-21 DIAGNOSIS — M25774 Osteophyte, right foot: Secondary | ICD-10-CM

## 2016-11-25 ENCOUNTER — Telehealth: Payer: Self-pay

## 2016-11-25 NOTE — Telephone Encounter (Signed)
Spoke with pt regarding post op status, she states that she is managing her pain effectively without having to take any narcotic pain medication. She did c/o of waves of nausea that come and go but are not intense enough to need medication. She denied calf pain and tenderness, also denied s/s of infection. Any acute symptom changes or concerns should be reported

## 2016-11-26 ENCOUNTER — Encounter: Payer: BC Managed Care – PPO | Admitting: Podiatry

## 2016-11-26 ENCOUNTER — Encounter: Payer: Self-pay | Admitting: Podiatry

## 2016-11-26 ENCOUNTER — Ambulatory Visit (INDEPENDENT_AMBULATORY_CARE_PROVIDER_SITE_OTHER): Payer: BC Managed Care – PPO

## 2016-11-26 ENCOUNTER — Ambulatory Visit (INDEPENDENT_AMBULATORY_CARE_PROVIDER_SITE_OTHER): Payer: Self-pay | Admitting: Podiatry

## 2016-11-26 DIAGNOSIS — Z9889 Other specified postprocedural states: Secondary | ICD-10-CM | POA: Diagnosis not present

## 2016-11-26 DIAGNOSIS — M898X9 Other specified disorders of bone, unspecified site: Secondary | ICD-10-CM

## 2016-11-26 DIAGNOSIS — M2011 Hallux valgus (acquired), right foot: Secondary | ICD-10-CM

## 2016-11-26 DIAGNOSIS — M2041 Other hammer toe(s) (acquired), right foot: Secondary | ICD-10-CM

## 2016-11-26 NOTE — Progress Notes (Signed)
She presents today for follow-up of her Lapidus procedure second metatarsal osteotomy hammertoe repair with pin second digit all on the right foot. She also had a dorsal tarsal exostectomy right foot. Date of surgery is 11/21/2016. States that she just bent the pin in her second toe as she was coming to our office hit the pin and bent the toe. She denies fever chills nausea and vomiting muscle aches and pains no chest pain pain no shortness of breath.  Objective: Vital signs are stable alert and oriented 3. She presents today ambulating with a scooter nonweightbearing in a cast intact. The distal aspect of the pin to the second toe is embedded into the toenail which I backed out today with no complications. Radiographs taken 3 views do demonstrate a nice Lapidus procedure second metatarsal osteotomy hammertoe repair with pin and a dorsal tarsal exostectomy. There does appear to be opinion that in the second toe is bent at the level of the metatarsophalangeal joint. She has good sensation to her toes and she has good range of motion of the toes and I recommend she continue to try to move the toes. I recommend continued keep this elevated nonweightbearing and ice medication as needed. She will continue her antibiotics because the prophylaxis of her knee surgery.  Assessment: Well-healed surgical for right.  Plan: Follow up with her in 1-2 weeks for cast change.

## 2016-12-03 ENCOUNTER — Ambulatory Visit (INDEPENDENT_AMBULATORY_CARE_PROVIDER_SITE_OTHER): Payer: BC Managed Care – PPO | Admitting: Podiatry

## 2016-12-03 ENCOUNTER — Encounter: Payer: Self-pay | Admitting: Podiatry

## 2016-12-03 DIAGNOSIS — M2011 Hallux valgus (acquired), right foot: Secondary | ICD-10-CM | POA: Diagnosis not present

## 2016-12-03 DIAGNOSIS — M898X9 Other specified disorders of bone, unspecified site: Secondary | ICD-10-CM | POA: Diagnosis not present

## 2016-12-03 DIAGNOSIS — M2041 Other hammer toe(s) (acquired), right foot: Secondary | ICD-10-CM | POA: Diagnosis not present

## 2016-12-03 DIAGNOSIS — Z9889 Other specified postprocedural states: Secondary | ICD-10-CM

## 2016-12-03 NOTE — Progress Notes (Signed)
She presents today for her second postop visit and first cast removal. She denies fever chills nausea vomiting muscle aches and pains presents utilizing a scooter. She denies chest pain shortness of breath pain.  Objective: Cast was removed dry sterile dressing removed. Sutures are intact margins are well coapted staples are intact. She is also status post dorsal exostectomy and second metatarsal osteotomy with screw fixation and pin fixation to the hammertoe. We removed all of the sutures today and all of the staples she has good range of motion of the first metatarsophalangeal joint.  Assessment: Well-healing surgical foot.  Plan: [place] today grasp a compressive dressing placed her back in a below knee cast and she will remain nonweightbearing for another 2 weeks at which time we will remove the cast and reevaluate. Radiographs will be taken at that time and probable application of another cast. She will notify us with questions or concerns she will continue to take her aspirin daily.

## 2016-12-17 ENCOUNTER — Encounter: Payer: Self-pay | Admitting: Podiatry

## 2016-12-17 ENCOUNTER — Ambulatory Visit (INDEPENDENT_AMBULATORY_CARE_PROVIDER_SITE_OTHER): Payer: BC Managed Care – PPO

## 2016-12-17 ENCOUNTER — Ambulatory Visit (INDEPENDENT_AMBULATORY_CARE_PROVIDER_SITE_OTHER): Payer: BC Managed Care – PPO | Admitting: Podiatry

## 2016-12-17 DIAGNOSIS — M898X9 Other specified disorders of bone, unspecified site: Secondary | ICD-10-CM

## 2016-12-17 DIAGNOSIS — Z9889 Other specified postprocedural states: Secondary | ICD-10-CM

## 2016-12-17 DIAGNOSIS — M2041 Other hammer toe(s) (acquired), right foot: Secondary | ICD-10-CM | POA: Diagnosis not present

## 2016-12-17 DIAGNOSIS — M2011 Hallux valgus (acquired), right foot: Secondary | ICD-10-CM | POA: Diagnosis not present

## 2016-12-17 NOTE — Progress Notes (Signed)
She presents today date of surgery 11/21/2016 for Lapidus procedure. He is just short of 1 month status post Lapidus Seckman osteotomy and hammertoe repair of the right foot. She presents today with cast denies chest pain shortness of breath.  Objective: Vital signs are stable alert and oriented 3 cast was removed demonstrates K wires intact of the second toe margins well coapted she has good range of motion of the first metatarsophalangeal joint minimal edema no erythema no cellulitis drainage or odor no skin breakdown.  Assessment: Well-healing surgical foot. Just short 1 month date of surgery 11/21/2016.  Plan: Place her in a Cam Walker today nonweightbearing status I will follow up with her in 2 weeks at which time we will pull K wire from the second toe.

## 2016-12-31 ENCOUNTER — Encounter: Payer: BC Managed Care – PPO | Admitting: Podiatry

## 2017-01-01 NOTE — Progress Notes (Signed)
DOS 12.29.2017 Lapidus with bunionectomy with internal screw fixation right. Metatarsal osteotomy with internal screw fixation 2nd met right. Hammertoe repair with screw or pin 2nd toe right. Dorsal exostectomy right. Below knee cast application right.

## 2017-01-05 ENCOUNTER — Ambulatory Visit (INDEPENDENT_AMBULATORY_CARE_PROVIDER_SITE_OTHER): Payer: Self-pay | Admitting: Podiatry

## 2017-01-05 ENCOUNTER — Ambulatory Visit (INDEPENDENT_AMBULATORY_CARE_PROVIDER_SITE_OTHER): Payer: BC Managed Care – PPO

## 2017-01-05 DIAGNOSIS — M2041 Other hammer toe(s) (acquired), right foot: Secondary | ICD-10-CM | POA: Diagnosis not present

## 2017-01-05 DIAGNOSIS — M898X9 Other specified disorders of bone, unspecified site: Secondary | ICD-10-CM | POA: Diagnosis not present

## 2017-01-05 DIAGNOSIS — M2011 Hallux valgus (acquired), right foot: Secondary | ICD-10-CM | POA: Diagnosis not present

## 2017-01-05 NOTE — Progress Notes (Signed)
She presents today for follow-up of her surgical foot. 2 surgery 11/21/2016 status post Lapidus procedure including bunionectomy right second metatarsal with hammertoe repair #2 #3 of the right foot. K wires in place to the second digit of the right foot. She states is doing very well just a little sensitive where the pin is.  Objective: Vital signs are stable she is alert and oriented 3. Pulses are palpable. Neurologic sensorium is intact. Deep tendon reflexes are intact and muscle strength is normal. K wires intact of the second toe and was once removed today the toe sat rectus. She is good range of motion of the first metatarsophalangeal joint and the second metatarsophalangeal joint.  Assessment: Well-healing surgical toe and foot right.  Plan: I localized the area today removed the K wire and put her in a Cam Walker for which she can bear weight. Follow up with her in 2 weeks for reevaluation.

## 2017-01-14 NOTE — Progress Notes (Signed)
This encounter was created in error - please disregard.

## 2017-01-19 ENCOUNTER — Ambulatory Visit (INDEPENDENT_AMBULATORY_CARE_PROVIDER_SITE_OTHER): Payer: BC Managed Care – PPO | Admitting: Podiatry

## 2017-01-19 ENCOUNTER — Ambulatory Visit (INDEPENDENT_AMBULATORY_CARE_PROVIDER_SITE_OTHER): Payer: BC Managed Care – PPO

## 2017-01-19 DIAGNOSIS — M898X9 Other specified disorders of bone, unspecified site: Secondary | ICD-10-CM

## 2017-01-19 DIAGNOSIS — Z79899 Other long term (current) drug therapy: Secondary | ICD-10-CM

## 2017-01-19 DIAGNOSIS — M2011 Hallux valgus (acquired), right foot: Secondary | ICD-10-CM

## 2017-01-19 DIAGNOSIS — M2041 Other hammer toe(s) (acquired), right foot: Secondary | ICD-10-CM | POA: Diagnosis not present

## 2017-01-19 DIAGNOSIS — L603 Nail dystrophy: Secondary | ICD-10-CM

## 2017-01-19 MED ORDER — TERBINAFINE HCL 250 MG PO TABS: 250.0000 mg | ORAL_TABLET | Freq: Every day | ORAL | 0 refills | Status: DC

## 2017-01-19 NOTE — Progress Notes (Addendum)
She presents today almost 8 weeks status post Lapidus procedure right foot. Second metatarsal osteotomy with screw fixation and a dorsal tarsal exostectomy. She states that until this is getting better every day as she presents with her [employer] crutches. She continues to wear the boot daily.  Objective: Vital signs are stable alert and oriented 3 pulses are palpable. Neurologic sensorium is intact. Great range of motion of the first metatarsophalangeal joint of the right foot the incision sites and gone on to heal uneventfully. Radiographs taken today demonstrate well-healing fusion site. Lapidus appears to be nearly 100% healed. Pathology of the toenails also demonstrates onychomycosis.  Assessment: Well-healing surgical foot. Onychomycosis.  Plan: We will allow her to start ambulating without the crutches. She does well leaving the office today without the crutches. States that there is no pain. In 2 weeks on her to progress to walking without the crutches and without the surgical boot possibly in her Darco shoe which was dispensed today and then progress on to regular shoe gear. I will follow-up with her 1 month. Started her on terbinafine 250 mg tablets 1 by mouth daily. Also require blood work which we wrote a request for today consisting of a liver profile. Should this come back abnormal I will notify her immediately.

## 2017-01-20 LAB — HEPATIC FUNCTION PANEL
ALK PHOS: 67 IU/L (ref 39–117)
ALT: 13 IU/L (ref 0–32)
AST: 16 IU/L (ref 0–40)
Albumin: 4.2 g/dL (ref 3.6–4.8)
BILIRUBIN TOTAL: 0.3 mg/dL (ref 0.0–1.2)
BILIRUBIN, DIRECT: 0.09 mg/dL (ref 0.00–0.40)
TOTAL PROTEIN: 6.1 g/dL (ref 6.0–8.5)

## 2017-01-21 ENCOUNTER — Telehealth: Payer: Self-pay | Admitting: *Deleted

## 2017-01-21 NOTE — Telephone Encounter (Addendum)
-----   Message from Garrel Ridgel, [place] sent at 01/21/2017  7:32 AM EST ----- Blood work looks good and may continue medication. I informed pt of Dr. Stephenie Acres orders.

## 2017-02-16 ENCOUNTER — Ambulatory Visit (INDEPENDENT_AMBULATORY_CARE_PROVIDER_SITE_OTHER): Payer: BC Managed Care – PPO

## 2017-02-16 ENCOUNTER — Encounter: Payer: Self-pay | Admitting: Podiatry

## 2017-02-16 ENCOUNTER — Ambulatory Visit (INDEPENDENT_AMBULATORY_CARE_PROVIDER_SITE_OTHER): Payer: BC Managed Care – PPO | Admitting: Podiatry

## 2017-02-16 DIAGNOSIS — M898X9 Other specified disorders of bone, unspecified site: Secondary | ICD-10-CM | POA: Diagnosis not present

## 2017-02-16 DIAGNOSIS — M2011 Hallux valgus (acquired), right foot: Secondary | ICD-10-CM

## 2017-02-16 DIAGNOSIS — M2041 Other hammer toe(s) (acquired), right foot: Secondary | ICD-10-CM | POA: Diagnosis not present

## 2017-02-16 DIAGNOSIS — L603 Nail dystrophy: Secondary | ICD-10-CM

## 2017-02-16 DIAGNOSIS — Z79899 Other long term (current) drug therapy: Secondary | ICD-10-CM

## 2017-02-16 MED ORDER — TERBINAFINE HCL 250 MG PO TABS: 250.0000 mg | ORAL_TABLET | Freq: Every day | ORAL | 0 refills | Status: DC

## 2017-02-16 NOTE — Progress Notes (Signed)
She presents today for 3 month postop visit status post Lapidus procedure right foot. Second metatarsal osteotomy dorsal tarsal exostosis hammertoe repair second right. She states that her foot is doing great and she has no problems taking the anti-fungal medication. She has a few days left before she is completely finished with her first dose of Lamisil. She denies fever chills nausea vomiting muscle aches and pains itching or rashes.  Objective: Vital signs are stable she is alert and oriented 3. Pulses are strongly palpable. Neurologic sensorium is intact. Deep tendon reflexes are intact. Muscle strength is normal bilateral. She has no pain on range of motion of the first metatarsophalangeal joint that foot since rectus radiographs demonstrate well-healed first metatarsal medial cuneiform joint fusion second metatarsal osteotomy. Toenails appear to be unchanged at this point.  Assessment: Well-healing surgical foot Lapidus and second metatarsal osteotomy with dorsal tarsal exostectomy right foot date of surgery 11/21/2016. Long-term therapy with Lamisil.  Plan: Requested another liver profile and dispense another 90 Lamisil tablets 250 mg with no refills. 1 by mouth daily follow-up with her in 4 months.

## 2017-02-27 LAB — HEPATIC FUNCTION PANEL
ALT: 16 IU/L (ref 0–32)
AST: 20 IU/L (ref 0–40)
Albumin: 4.3 g/dL (ref 3.6–4.8)
Alkaline Phosphatase: 68 IU/L (ref 39–117)
BILIRUBIN TOTAL: 0.3 mg/dL (ref 0.0–1.2)
Bilirubin, Direct: 0.11 mg/dL (ref 0.00–0.40)
Total Protein: 6.3 g/dL (ref 6.0–8.5)

## 2017-03-03 ENCOUNTER — Telehealth: Payer: Self-pay | Admitting: *Deleted

## 2017-03-03 NOTE — Telephone Encounter (Addendum)
-----   Message from Garrel Ridgel, [place] sent at 02/27/2017 12:56 PM EDT ----- Labs WNL. Patient can continue with Lamisil. 03/03/2017-Left message informing pt of Dr.Hyatt's orders.

## 2017-06-15 ENCOUNTER — Other Ambulatory Visit: Payer: Self-pay | Admitting: Family Medicine

## 2017-06-15 DIAGNOSIS — E785 Hyperlipidemia, unspecified: Secondary | ICD-10-CM

## 2017-06-16 ENCOUNTER — Other Ambulatory Visit (INDEPENDENT_AMBULATORY_CARE_PROVIDER_SITE_OTHER): Payer: BC Managed Care – PPO

## 2017-06-16 DIAGNOSIS — E785 Hyperlipidemia, unspecified: Secondary | ICD-10-CM

## 2017-06-16 NOTE — Addendum Note (Signed)
Addended by: Ellamae Sia on: 06/16/2017 08:51 AM   Modules accepted: Orders

## 2017-06-17 LAB — BASIC METABOLIC PANEL
BUN/Creatinine Ratio: 18 (ref 12–28)
BUN: 14 mg/dL (ref 8–27)
CALCIUM: 9.3 mg/dL (ref 8.7–10.3)
CO2: 25 mmol/L (ref 20–29)
CREATININE: 0.79 mg/dL (ref 0.57–1.00)
Chloride: 103 mmol/L (ref 96–106)
GFR calc Af Amer: 90 mL/min/{1.73_m2} (ref >59)
GFR calc non Af Amer: 78 mL/min/{1.73_m2} (ref >59)
GLUCOSE: 97 mg/dL (ref 65–99)
POTASSIUM: 4.6 mmol/L (ref 3.5–5.2)
SODIUM: 144 mmol/L (ref 134–144)

## 2017-06-17 LAB — LIPID PANEL
CHOLESTEROL TOTAL: 193 mg/dL (ref 100–199)
Chol/HDL Ratio: 2.8 ratio (ref 0.0–4.4)
HDL: 70 mg/dL (ref >39)
LDL Calculated: 104 mg/dL — ABNORMAL HIGH (ref 0–99)
Triglycerides: 96 mg/dL (ref 0–149)
VLDL Cholesterol Cal: 19 mg/dL (ref 5–40)

## 2017-06-22 ENCOUNTER — Encounter: Payer: Self-pay | Admitting: Podiatry

## 2017-06-22 ENCOUNTER — Ambulatory Visit (INDEPENDENT_AMBULATORY_CARE_PROVIDER_SITE_OTHER): Payer: BC Managed Care – PPO | Admitting: Podiatry

## 2017-06-22 DIAGNOSIS — Z79899 Other long term (current) drug therapy: Secondary | ICD-10-CM

## 2017-06-22 DIAGNOSIS — L603 Nail dystrophy: Secondary | ICD-10-CM

## 2017-06-22 MED ORDER — TERBINAFINE HCL 250 MG PO TABS: 250.0000 mg | ORAL_TABLET | Freq: Every day | ORAL | 0 refills | Status: DC

## 2017-06-22 NOTE — Progress Notes (Signed)
She presents today for follow-up of her fungus. States that she completed 120 days of Lamisil without complications or without any help rolls. She states her toenails are doing much better.  Objective: Nails appear to be clearing of some residual fungus still present.  Assessment: Onychomycosis.  Plan: Long-term treatment with Lamisil we will start Lamisil 250 mg tablets 1 every other day for the next 2 months I will follow up with her in 3 months.

## 2017-06-23 ENCOUNTER — Ambulatory Visit (INDEPENDENT_AMBULATORY_CARE_PROVIDER_SITE_OTHER): Payer: BC Managed Care – PPO | Admitting: Family Medicine

## 2017-06-23 ENCOUNTER — Encounter: Payer: Self-pay | Admitting: Family Medicine

## 2017-06-23 VITALS — BP 122/72 | HR 86 | Temp 98.6°F | Ht 60.0 in | Wt 175.0 lb

## 2017-06-23 DIAGNOSIS — Z0000 Encounter for general adult medical examination without abnormal findings: Secondary | ICD-10-CM | POA: Diagnosis not present

## 2017-06-23 DIAGNOSIS — Z7189 Other specified counseling: Secondary | ICD-10-CM

## 2017-06-23 DIAGNOSIS — E785 Hyperlipidemia, unspecified: Secondary | ICD-10-CM

## 2017-06-23 DIAGNOSIS — Z23 Encounter for immunization: Secondary | ICD-10-CM

## 2017-06-23 DIAGNOSIS — E669 Obesity, unspecified: Secondary | ICD-10-CM

## 2017-06-23 MED ORDER — RIZATRIPTAN BENZOATE 10 MG PO TABS: ORAL_TABLET | ORAL | 3 refills | Status: DC

## 2017-06-23 MED ORDER — ALBUTEROL SULFATE HFA 108 (90 BASE) MCG/ACT IN AERS: INHALATION_SPRAY | RESPIRATORY_TRACT | 6 refills | Status: DC

## 2017-06-23 NOTE — Assessment & Plan Note (Signed)
Preventative protocols reviewed and updated unless pt declined. Discussed healthy diet and lifestyle.  

## 2017-06-23 NOTE — Progress Notes (Signed)
BP 122/72   Pulse 86   Temp 98.6 F (37 C) (Oral)   Ht 5' (1.524 m)   Wt 175 lb (79.4 kg)   SpO2 97%   BMI 34.18 kg/m    CC: CPE Subjective:    Patient ID: Dana Alvarez, female    DOB: [birth year], 66 y.o.   MRN: [id number]  HPI: Dana Alvarez is a 66 y.o. female presenting on 06/23/2017 for Annual Exam   Does not yet have medicare. Planning on getting this next month.  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  She did have recent surgery. Seeing podiatrist regularly. Planned long term lamisil 250mg  treatment.   Preventative: COLONOSCOPY Date: 05/2015 2 polyps, mild diverticulosis, rpt 5 yrs ([facility]) Well woman - [facility] sees PA for well woman. Normal mammo/pap/breast exam this year. Gets every 2 years.  DEXA WNL 2017 Flu shot yearly. Tdap 2012 prevnar 2017, pneumovax today  shingrix - discussed - will check with pharmacy Advanced planning - does not have set up. Has upcoming apt with lawyer Seat belt use discussed Sunscreen use discussed. No changing moles on skin Ex smoker - quit 1989 Alcohol - 1 glass wine occasionally  Adopted--part native [place]  Married; lives with husband; dog  3 children  Occupation: school Therapist, sports, prior Licensed conveyancer at [employer], prior Therapist, sports at [employer] Activity: swims daily Diet: good water, fruits/vegetables daily   Relevant past medical, surgical, family and social history reviewed and updated as indicated. Interim medical history since our last visit reviewed. Allergies and medications reviewed and updated. Outpatient Medications Prior to Visit  Medication Sig Dispense Refill  . cholecalciferol (VITAMIN D) 1000 units tablet Take 1,000 Units by mouth daily.    . Probiotic Product (PROBIOTIC DAILY PO) Take by mouth. Probiotic gummy -Take one daily    . Multiple Vitamin (MULTIVITAMIN) tablet Take 1 tablet by mouth daily.    . rizatriptan (MAXALT) 10 MG tablet TAKE ONE TABLET AT FIRST  SIGN OF MIGRAINE SYMPTOMS - MAY REPEAT IN TWO HOURS IF NO RELIEF 10 tablet 3  . terbinafine (LAMISIL) 250 MG tablet Take 1 tablet (250 mg total) by mouth daily. 30 tablet 0  . VENTOLIN HFA 108 (90 Base) MCG/ACT inhaler INHALE TWO PUFFS INTO THE LUNGS EVERY SIX HOURS AS NEEDED FOR WHEEZING 18 g 6   No facility-administered medications prior to visit.      Per HPI unless specifically indicated in ROS section below Review of Systems     Objective:    BP 122/72   Pulse 86   Temp 98.6 F (37 C) (Oral)   Ht 5' (1.524 m)   Wt 175 lb (79.4 kg)   SpO2 97%   BMI 34.18 kg/m   Wt Readings from Last 3 Encounters:  06/23/17 175 lb (79.4 kg)  02/20/16 179 lb 4 oz (81.3 kg)  05/15/15 180 lb (81.6 kg)    Physical Exam  Constitutional: She is oriented to person, place, and time. She appears well-developed and well-nourished. No distress.  HENT:  Head: Normocephalic and atraumatic.  Right Ear: Hearing, tympanic membrane, external ear and ear canal normal.  Left Ear: Hearing, tympanic membrane, external ear and ear canal normal.  Nose: Nose normal.  Mouth/Throat: Uvula is midline, oropharynx is clear and moist and mucous membranes are normal. No oropharyngeal exudate, posterior oropharyngeal edema or posterior oropharyngeal erythema.  Eyes: Pupils are equal, round, and reactive to light. Conjunctivae and EOM are normal.  No scleral icterus.  Neck: Normal range of motion. Neck supple. Carotid bruit is not present. No thyromegaly present.  Cardiovascular: Normal rate, regular rhythm, normal heart sounds and intact distal pulses.   No murmur heard. Pulses:      Radial pulses are 2+ on the right side, and 2+ on the left side.  Pulmonary/Chest: Effort normal and breath sounds normal. No respiratory distress. She has no wheezes. She has no rales.  Abdominal: Soft. Bowel sounds are normal. She exhibits no distension and no mass. There is no tenderness. There is no rebound and no guarding.    Musculoskeletal: Normal range of motion. She exhibits no edema.  Lymphadenopathy:    She has no cervical adenopathy.  Neurological: She is alert and oriented to person, place, and time.  CN grossly intact, station and gait intact  Skin: Skin is warm and dry. No rash noted.  Psychiatric: She has a normal mood and affect. Her behavior is normal. Judgment and thought content normal.  Nursing note and vitals reviewed.  Results for orders placed or performed in visit on 06/23/17  HM MAMMOGRAPHY  Result Value Ref Range   HM Mammogram Self Reported Normal 0-4 Bi-Rad, Self Reported Normal  HM PAP SMEAR  Result Value Ref Range   HM Pap smear self reported normal       Assessment & Plan:   Problem List Items Addressed This Visit    Advanced care planning/counseling discussion    Advanced planning - does not have set up. Has upcoming apt with lawyer      Healthcare maintenance - Primary    Preventative protocols reviewed and updated unless pt declined. Discussed healthy diet and lifestyle.       Mild hyperlipidemia    Stable off meds. The 10-year ASCVD risk score Mikey Bussing DC Brooke Bonito., et al., 2013) is: 5.1%   Values used to calculate the score:     Age: 69 years     Sex: Female     Is Non-Hispanic African American: No     Diabetic: No     Tobacco smoker: No     Systolic Blood Pressure: 832 mmHg     Is BP treated: No     HDL Cholesterol: 70 mg/dL     Total Cholesterol: 193 mg/dL       Obesity, Class I, BMI 30-34.9    Discussed healthy diet and lifestyle changes to affect sustainable weight loss.            Follow up plan: Return in about 1 year (around 06/23/2018) for annual exam, prior fasting for blood work.  Ria Bush, MD

## 2017-06-23 NOTE — Assessment & Plan Note (Addendum)
Discussed healthy diet and lifestyle changes to affect sustainable weight loss  

## 2017-06-23 NOTE — Assessment & Plan Note (Addendum)
Stable off meds. The 10-year ASCVD risk score Mikey Bussing DC Brooke Bonito., et al., 2013) is: 5.1%   Values used to calculate the score:     Age: 66 years     Sex: Female     Is Non-Hispanic African American: No     Diabetic: No     Tobacco smoker: No     Systolic Blood Pressure: 981 mmHg     Is BP treated: No     HDL Cholesterol: 70 mg/dL     Total Cholesterol: 193 mg/dL

## 2017-06-23 NOTE — Addendum Note (Signed)
Addended by: Modena Nunnery on: 06/23/2017 12:39 PM   Modules accepted: Orders

## 2017-06-23 NOTE — Patient Instructions (Addendum)
Pneumovax today If interested, check with pharmacy about new 2 shot shingles series (shingrix).  You are doing well today. Return as needed or in 1 year for next physical. Health Maintenance, Female Adopting a healthy lifestyle and getting preventive care can go a long way to promote health and wellness. Talk with your health care provider about what schedule of regular examinations is right for you. This is a good chance for you to check in with your provider about disease prevention and staying healthy. In between checkups, there are plenty of things you can do on your own. Experts have done a lot of research about which lifestyle changes and preventive measures are most likely to keep you healthy. Ask your health care provider for more information. Weight and diet Eat a healthy diet  Be sure to include plenty of vegetables, fruits, low-fat dairy products, and lean protein.  Do not eat a lot of foods high in solid fats, added sugars, or salt.  Get regular exercise. This is one of the most important things you can do for your health. ? Most adults should exercise for at least 150 minutes each week. The exercise should increase your heart rate and make you sweat (moderate-intensity exercise). ? Most adults should also do strengthening exercises at least twice a week. This is in addition to the moderate-intensity exercise.  Maintain a healthy weight  Body mass index (BMI) is a measurement that can be used to identify possible weight problems. It estimates body fat based on height and weight. Your health care provider can help determine your BMI and help you achieve or maintain a healthy weight.  For females 56 years of age and older: ? A BMI below 18.5 is considered underweight. ? A BMI of 18.5 to 24.9 is normal. ? A BMI of 25 to 29.9 is considered overweight. ? A BMI of 30 and above is considered obese.  Watch levels of cholesterol and blood lipids  You should start having your blood  tested for lipids and cholesterol at 66 years of age, then have this test every 5 years.  You may need to have your cholesterol levels checked more often if: ? Your lipid or cholesterol levels are high. ? You are older than 66 years of age. ? You are at high risk for heart disease.  Cancer screening Lung Cancer  Lung cancer screening is recommended for adults 2-40 years old who are at high risk for lung cancer because of a history of smoking.  A yearly low-dose CT scan of the lungs is recommended for people who: ? Currently smoke. ? Have quit within the past 15 years. ? Have at least a 30-pack-year history of smoking. A pack year is smoking an average of one pack of cigarettes a day for 1 year.  Yearly screening should continue until it has been 15 years since you quit.  Yearly screening should stop if you develop a health problem that would prevent you from having lung cancer treatment.  Breast Cancer  Practice breast self-awareness. This means understanding how your breasts normally appear and feel.  It also means doing regular breast self-exams. Let your health care provider know about any changes, no matter how small.  If you are in your 20s or 30s, you should have a clinical breast exam (CBE) by a health care provider every 1-3 years as part of a regular health exam.  If you are 5 or older, have a CBE every year. Also consider having a breast  X-ray (mammogram) every year.  If you have a family history of breast cancer, talk to your health care provider about genetic screening.  If you are at high risk for breast cancer, talk to your health care provider about having an MRI and a mammogram every year.  Breast cancer gene (BRCA) assessment is recommended for women who have family members with BRCA-related cancers. BRCA-related cancers include: ? Breast. ? Ovarian. ? Tubal. ? Peritoneal cancers.  Results of the assessment will determine the need for genetic counseling and  BRCA1 and BRCA2 testing.  Cervical Cancer Your health care provider may recommend that you be screened regularly for cancer of the pelvic organs (ovaries, uterus, and vagina). This screening involves a pelvic examination, including checking for microscopic changes to the surface of your cervix (Pap test). You may be encouraged to have this screening done every 3 years, beginning at age 41.  For women ages 53-65, health care providers may recommend pelvic exams and Pap testing every 3 years, or they may recommend the Pap and pelvic exam, combined with testing for human papilloma virus (HPV), every 5 years. Some types of HPV increase your risk of cervical cancer. Testing for HPV may also be done on women of any age with unclear Pap test results.  Other health care providers may not recommend any screening for nonpregnant women who are considered low risk for pelvic cancer and who do not have symptoms. Ask your health care provider if a screening pelvic exam is right for you.  If you have had past treatment for cervical cancer or a condition that could lead to cancer, you need Pap tests and screening for cancer for at least 20 years after your treatment. If Pap tests have been discontinued, your risk factors (such as having a new sexual partner) need to be reassessed to determine if screening should resume. Some women have medical problems that increase the chance of getting cervical cancer. In these cases, your health care provider may recommend more frequent screening and Pap tests.  Colorectal Cancer  This type of cancer can be detected and often prevented.  Routine colorectal cancer screening usually begins at 66 years of age and continues through 66 years of age.  Your health care provider may recommend screening at an earlier age if you have risk factors for colon cancer.  Your health care provider may also recommend using home test kits to check for hidden blood in the stool.  A small camera  at the end of a tube can be used to examine your colon directly (sigmoidoscopy or colonoscopy). This is done to check for the earliest forms of colorectal cancer.  Routine screening usually begins at age 69.  Direct examination of the colon should be repeated every 5-10 years through 66 years of age. However, you may need to be screened more often if early forms of precancerous polyps or small growths are found.  Skin Cancer  Check your skin from head to toe regularly.  Tell your health care provider about any new moles or changes in moles, especially if there is a change in a mole's shape or color.  Also tell your health care provider if you have a mole that is larger than the size of a pencil eraser.  Always use sunscreen. Apply sunscreen liberally and repeatedly throughout the day.  Protect yourself by wearing long sleeves, pants, a wide-brimmed hat, and sunglasses whenever you are outside.  Heart disease, diabetes, and high blood pressure  High blood  pressure causes heart disease and increases the risk of stroke. High blood pressure is more likely to develop in: ? People who have blood pressure in the high end of the normal range (130-139/85-89 mm Hg). ? People who are overweight or obese. ? People who are African American.  If you are 67-37 years of age, have your blood pressure checked every 3-5 years. If you are 29 years of age or older, have your blood pressure checked every year. You should have your blood pressure measured twice-once when you are at a hospital or clinic, and once when you are not at a hospital or clinic. Record the average of the two measurements. To check your blood pressure when you are not at a hospital or clinic, you can use: ? An automated blood pressure machine at a pharmacy. ? A home blood pressure monitor.  If you are between 6 years and 14 years old, ask your health care provider if you should take aspirin to prevent strokes.  Have regular diabetes  screenings. This involves taking a blood sample to check your fasting blood sugar level. ? If you are at a normal weight and have a low risk for diabetes, have this test once every three years after 66 years of age. ? If you are overweight and have a high risk for diabetes, consider being tested at a younger age or more often. Preventing infection Hepatitis B  If you have a higher risk for hepatitis B, you should be screened for this virus. You are considered at high risk for hepatitis B if: ? You were born in a country where hepatitis B is common. Ask your health care provider which countries are considered high risk. ? Your parents were born in a high-risk country, and you have not been immunized against hepatitis B (hepatitis B vaccine). ? You have HIV or AIDS. ? You use needles to inject street drugs. ? You live with someone who has hepatitis B. ? You have had sex with someone who has hepatitis B. ? You get hemodialysis treatment. ? You take certain medicines for conditions, including cancer, organ transplantation, and autoimmune conditions.  Hepatitis C  Blood testing is recommended for: ? Everyone born from [birth year] through 1965. ? Anyone with known risk factors for hepatitis C.  Sexually transmitted infections (STIs)  You should be screened for sexually transmitted infections (STIs) including gonorrhea and chlamydia if: ? You are sexually active and are younger than 66 years of age. ? You are older than 66 years of age and your health care provider tells you that you are at risk for this type of infection. ? Your sexual activity has changed since you were last screened and you are at an increased risk for chlamydia or gonorrhea. Ask your health care provider if you are at risk.  If you do not have HIV, but are at risk, it may be recommended that you take a prescription medicine daily to prevent HIV infection. This is called pre-exposure prophylaxis (PrEP). You are considered at risk  if: ? You are sexually active and do not regularly use condoms or know the HIV status of your partner(s). ? You take drugs by injection. ? You are sexually active with a partner who has HIV.  Talk with your health care provider about whether you are at high risk of being infected with HIV. If you choose to begin PrEP, you should first be tested for HIV. You should then be tested every 3 months for as  long as you are taking PrEP. Pregnancy  If you are premenopausal and you may become pregnant, ask your health care provider about preconception counseling.  If you may become pregnant, take 400 to 800 micrograms (mcg) of folic acid every day.  If you want to prevent pregnancy, talk to your health care provider about birth control (contraception). Osteoporosis and menopause  Osteoporosis is a disease in which the bones lose minerals and strength with aging. This can result in serious bone fractures. Your risk for osteoporosis can be identified using a bone density scan.  If you are 94 years of age or older, or if you are at risk for osteoporosis and fractures, ask your health care provider if you should be screened.  Ask your health care provider whether you should take a calcium or vitamin D supplement to lower your risk for osteoporosis.  Menopause may have certain physical symptoms and risks.  Hormone replacement therapy may reduce some of these symptoms and risks. Talk to your health care provider about whether hormone replacement therapy is right for you. Follow these instructions at home:  Schedule regular health, dental, and eye exams.  Stay current with your immunizations.  Do not use any tobacco products including cigarettes, chewing tobacco, or electronic cigarettes.  If you are pregnant, do not drink alcohol.  If you are breastfeeding, limit how much and how often you drink alcohol.  Limit alcohol intake to no more than 1 drink per day for nonpregnant women. One drink  equals 12 ounces of beer, 5 ounces of wine, or 1 ounces of hard liquor.  Do not use street drugs.  Do not share needles.  Ask your health care provider for help if you need support or information about quitting drugs.  Tell your health care provider if you often feel depressed.  Tell your health care provider if you have ever been abused or do not feel safe at home. This information is not intended to replace advice given to you by your health care provider. Make sure you discuss any questions you have with your health care provider. Document Released: 05/26/2011 Document Revised: 04/17/2016 Document Reviewed: 08/14/2015 Elsevier Interactive Patient Education  [employer].

## 2017-06-23 NOTE — Assessment & Plan Note (Signed)
Advanced planning - does not have set up. Has upcoming apt with lawyer

## 2017-06-25 ENCOUNTER — Telehealth: Payer: Self-pay

## 2017-06-25 NOTE — Telephone Encounter (Signed)
Noted. Would suggest cool compress to skin, could also try antihistamine like claritin.

## 2017-06-25 NOTE — Telephone Encounter (Signed)
Pt left v/m; pt received pneumovax on 06/23/17 and pt wanted Dr Darnell Level to know that pt has had a large site base reaction with large tender to touch hives that past the diameter of her arm. Site base feels warm to touch but no fever. Pt said she feels OK and does not thinks she needs anything but wanted Dr Darnell Level to be aware.

## 2017-06-26 NOTE — Telephone Encounter (Signed)
Spoke with pt about Dr. Synthia Innocent message and recommendations. Pt voiced understanding and stated as of today her symptoms has subsided. She had no additional questions at this time. Nothing further is needed

## 2017-07-08 ENCOUNTER — Ambulatory Visit (INDEPENDENT_AMBULATORY_CARE_PROVIDER_SITE_OTHER): Payer: BC Managed Care – PPO | Admitting: Obstetrics and Gynecology

## 2017-07-08 ENCOUNTER — Encounter: Payer: Self-pay | Admitting: Obstetrics and Gynecology

## 2017-07-08 VITALS — BP 110/70 | HR 77 | Ht 62.0 in | Wt 178.0 lb

## 2017-07-08 DIAGNOSIS — Z124 Encounter for screening for malignant neoplasm of cervix: Secondary | ICD-10-CM | POA: Diagnosis not present

## 2017-07-08 DIAGNOSIS — Z01419 Encounter for gynecological examination (general) (routine) without abnormal findings: Secondary | ICD-10-CM

## 2017-07-08 DIAGNOSIS — N951 Menopausal and female climacteric states: Secondary | ICD-10-CM

## 2017-07-08 DIAGNOSIS — Z1231 Encounter for screening mammogram for malignant neoplasm of breast: Secondary | ICD-10-CM | POA: Diagnosis not present

## 2017-07-08 DIAGNOSIS — Z1151 Encounter for screening for human papillomavirus (HPV): Secondary | ICD-10-CM | POA: Diagnosis not present

## 2017-07-08 DIAGNOSIS — Z1239 Encounter for other screening for malignant neoplasm of breast: Secondary | ICD-10-CM

## 2017-07-08 MED ORDER — ESTRADIOL 0.1 MG/GM VA CREA: TOPICAL_CREAM | VAGINAL | 1 refills | Status: AC

## 2017-07-08 NOTE — Progress Notes (Signed)
Chief Complaint  Patient presents with  . Gynecologic Exam    HPI:      Ms. Dana Alvarez is a 66 y.o. [phone] who LMP was No LMP recorded. Patient is postmenopausal., presents today for her annual examination.  Her menses are absent due to menopause. She does not have intermenstrual bleeding.  She does not have vasomotor sx.   Sex activity: single partner, contraception - post menopausal status. She does have vaginal dryness, even with lubricant use.  Last Pap: October 27, 2014  Results were: no abnormalities   Hx of STDs: none  Last mammogram: May 24, 2016  Results were: normal--routine follow-up in 12 months Pt is adopted but per her knowledge on mat side only, there is no FH of breast cancer. There is no FH of ovarian cancer. The patient does do self-breast exams.  Colonoscopy: colonoscopy 2 years ago with polyps. Repeat due in 5 yrs.   Tobacco use: The patient denies current or previous tobacco use. Alcohol use: none Exercise: very active  She does get adequate calcium and Vitamin D in her diet. Labs with PCP.  Past Medical History:  Diagnosis Date  . 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 History of migraines   . Low BP    Per pt,some sedation cause B/P to drop.  . Mild hyperlipidemia   . Obesity   . Shellfish allergy     Past Surgical History:  Procedure Laterality Date  . CESAREAN SECTION     x3  . COLONOSCOPY  05/2015   2 polyps, mild diverticulosis, rpt 5 yrs ([facility])  . DERMABRASION OF ARM     right arm--plastic surgery  . DEXA  02/2016   WNL T-0.3,-0.9  . FOOT SURGERY    . HIP SURGERY    . REPLACEMENT TOTAL KNEE BILATERAL  2013   ([facility])  . TOTAL HIP ARTHROPLASTY  2010   Right    Family History  Problem Relation Age of Onset  . Adopted: Yes    Social History   Social History  . Marital status: Married    Spouse name: N/A  . Number of children: 3  . Years of education: N/A   Occupational History  . School nurse  [employer]   Social History Main Topics  . Smoking status: Former Smoker    Years: 20.00    Types: Cigarettes    Quit date: 11/25/1987  . Smokeless tobacco: Never Used  . Alcohol use 0.6 oz/week    1 Glasses of wine per week     Comment: wine--1 glass biweekly  . Drug use: No  . Sexual activity: Yes   Other Topics Concern  . Not on file   Social History Narrative   Adopted--part native [place]   Married; lives with husband; dog   3 children   Occupation: school Therapist, sports, prior Licensed conveyancer at [employer], prior Therapist, sports at [employer]   Activity: swims daily   Diet: good water, fruits/vegetables daily     Current Outpatient Prescriptions:  .  albuterol (VENTOLIN HFA) 108 (90 Base) MCG/ACT inhaler, INHALE TWO PUFFS INTO THE LUNGS EVERY SIX HOURS AS NEEDED FOR WHEEZING, Disp: 18 g, Rfl: 6 .  cholecalciferol (VITAMIN D) 1000 units tablet, Take 1,000 Units by mouth daily., Disp: , Rfl:  .  estradiol (ESTRACE) 0.1 MG/GM vaginal cream, Insert 1g nightly for 1 wk, then 1 g once weekly as maintenace, Disp: 42.5 g, Rfl: 1 .  Multiple Vitamins-Minerals (MULTIVITAMIN ADULT) TABS, Take 1 tablet by mouth daily., Disp: , Rfl:  .  Probiotic Product (PROBIOTIC DAILY PO), Take by mouth. Probiotic gummy -Take one daily, Disp: , Rfl:  .  rizatriptan (MAXALT) 10 MG tablet, TAKE ONE TABLET AT FIRST SIGN OF MIGRAINE SYMPTOMS - MAY REPEAT IN TWO HOURS IF NO RELIEF, Disp: 10 tablet, Rfl: 3   ROS:  Review of Systems  Constitutional: Negative for fatigue, fever and unexpected weight change.  Respiratory: Negative for cough, shortness of breath and wheezing.   Cardiovascular: Negative for chest pain, palpitations and leg swelling.  Gastrointestinal: Negative for blood in stool, constipation, diarrhea, nausea and vomiting.  Endocrine: Negative for cold intolerance, heat intolerance and polyuria.  Genitourinary: Positive for dyspareunia. Negative for dysuria, flank pain, frequency,  genital sores, hematuria, menstrual problem, pelvic pain, urgency, vaginal bleeding, vaginal discharge and vaginal pain.  Musculoskeletal: Negative for back pain, joint swelling and myalgias.  Skin: Negative for rash.  Neurological: Negative for dizziness, syncope, light-headedness, numbness and headaches.  Hematological: Negative for adenopathy.  Psychiatric/Behavioral: Negative for agitation, confusion, sleep disturbance and suicidal ideas. The patient is not nervous/anxious.      Objective: BP 110/70   Pulse 77   Ht 5\' 2"  (1.575 m)   Wt 178 lb (80.7 kg)   BMI 32.56 kg/m    Physical Exam  Constitutional: She is oriented to person, place, and time. She appears well-developed and well-nourished.  Genitourinary: Vagina normal and uterus normal. There is no rash or tenderness on the right labia. There is no rash or tenderness on the left labia. No erythema or tenderness in the vagina. No vaginal discharge found. Right adnexum does not display mass and does not display tenderness. Left adnexum does not display mass and does not display tenderness. Cervix does not exhibit motion tenderness or polyp. Uterus is not enlarged or tender.  Genitourinary Comments: MILD VAGINAL ATROPHY  Neck: Normal range of motion. No thyromegaly present.  Cardiovascular: Normal rate, regular rhythm and normal heart sounds.   No murmur heard. Pulmonary/Chest: Effort normal and breath sounds normal. Right breast exhibits no mass, no nipple discharge, no skin change and no tenderness. Left breast exhibits no mass, no nipple discharge, no skin change and no tenderness.  Abdominal: Soft. There is no tenderness. There is no guarding.  Musculoskeletal: Normal range of motion.  Neurological: She is alert and oriented to person, place, and time. No cranial nerve deficit.  Psychiatric: She has a normal mood and affect. Her behavior is normal.  Vitals reviewed.    Assessment/Plan:  Encounter for annual routine  gynecological examination  Cervical cancer screening - Plan: IGP, Aptima HPV  Screening for HPV (human papillomavirus) - Plan: IGP, Aptima HPV  Screening for breast cancer - Pt to sched mammo. - Plan: MM DIGITAL SCREENING BILATERAL  Vaginal dryness, menopausal - Try estrace crm. Rx/coupon card. Cont lubricants. F/u prn. - Plan: estradiol (ESTRACE) 0.1 MG/GM vaginal cream   Meds ordered this encounter  Medications  . estradiol (ESTRACE) 0.1 MG/GM vaginal cream    Sig: Insert 1g nightly for 1 wk, then 1 g once weekly as maintenace    Dispense:  42.5 g    Refill:  1            GYN counsel mammography screening, menopause, adequate intake of calcium and vitamin D    F/U  Return in about 1 year (around 07/08/2018).  Ekaterina Denise B. Darene Nappi, PA-C 07/08/2017 4:44 PM

## 2017-07-08 NOTE — Patient Instructions (Signed)
[facility]: [phone]  [facility]: [phone] 

## 2017-07-11 LAB — IGP, APTIMA HPV
HPV APTIMA: NEGATIVE
PAP Smear Comment: 0

## 2017-09-09 ENCOUNTER — Encounter: Payer: Self-pay | Admitting: Obstetrics and Gynecology

## 2017-09-23 ENCOUNTER — Ambulatory Visit (INDEPENDENT_AMBULATORY_CARE_PROVIDER_SITE_OTHER): Payer: Medicare Other | Admitting: Podiatry

## 2017-09-23 DIAGNOSIS — L603 Nail dystrophy: Secondary | ICD-10-CM | POA: Diagnosis not present

## 2017-09-23 MED ORDER — TERBINAFINE HCL 250 MG PO TABS: 250.0000 mg | ORAL_TABLET | Freq: Every day | ORAL | 0 refills | Status: DC

## 2017-09-23 NOTE — Patient Instructions (Signed)
Dr. Hyatt has sent over a refill for Lamisil to your pharmacy today. The instructions on your bottle will say "take 1 tablet daily", however, he would like for you to take one pill every other day. He will follow up with you in 3 months to re-evaluate your toenails. 

## 2017-09-23 NOTE — Progress Notes (Signed)
She presents today for follow-up of her Lamisil therapy left foot. She is approximately 85-90% grown out. She is very happy with the outcome thus far. She would like to consider continuing taking the medication.  Objective: About 85-90% grown out. Third nail left foot appears to be the worst.  Assessment: Onychomycosis resolving.  Plan: Started back on Lamisil 1 tablet every other day for the next 2 months. Follow up with her on the third month.

## 2017-12-28 ENCOUNTER — Encounter: Payer: Self-pay | Admitting: Podiatry

## 2017-12-28 ENCOUNTER — Ambulatory Visit: Payer: Medicare Other | Admitting: Podiatry

## 2017-12-28 DIAGNOSIS — M7752 Other enthesopathy of left foot: Secondary | ICD-10-CM

## 2017-12-28 DIAGNOSIS — L603 Nail dystrophy: Secondary | ICD-10-CM

## 2017-12-28 DIAGNOSIS — M778 Other enthesopathies, not elsewhere classified: Secondary | ICD-10-CM

## 2017-12-28 DIAGNOSIS — M779 Enthesopathy, unspecified: Secondary | ICD-10-CM

## 2017-12-28 NOTE — Progress Notes (Signed)
She presents today for follow-up of her onychomycosis.  States that she has completed 120 days plus an additional dosing schedule.  It seems to be growing out looking a lot better.  She is also concerned about the dorsal aspect of her left foot.  Objective: Vital signs are stable she is alert and oriented x3.  No significant reproducible pain on palpation of the arthritis to the dorsal aspect of the left foot.  But is obvious that there is arthritis present.  Distal more than likely flareup again and she will need an injection at that time.  Her fungus is resolving and looks much improved however it has not grown out all the way.  Assessment: Long-term therapy with for onychomycosis using terbinafine.  Osteoarthritis dorsal aspect left foot.  Bunion deformity left foot.  Plan: Well-healing onychomycosis.  Follow-up with her as needed.

## 2018-04-02 ENCOUNTER — Other Ambulatory Visit: Payer: Self-pay | Admitting: Family Medicine

## 2019-03-24 ENCOUNTER — Encounter: Payer: Self-pay | Admitting: Family Medicine

## 2019-03-24 DIAGNOSIS — E785 Hyperlipidemia, unspecified: Secondary | ICD-10-CM

## 2019-03-24 DIAGNOSIS — G43909 Migraine, unspecified, not intractable, without status migrainosus: Secondary | ICD-10-CM

## 2019-03-24 NOTE — Telephone Encounter (Signed)
This appt looks like ot will be a Doxyme do you want them to do their CPE labs in advance?

## 2019-03-25 NOTE — Telephone Encounter (Signed)
Yes plz schedule fasting lab visit for early next week. I have ordered labs.

## 2019-03-29 ENCOUNTER — Other Ambulatory Visit: Payer: Self-pay

## 2019-03-29 ENCOUNTER — Other Ambulatory Visit (INDEPENDENT_AMBULATORY_CARE_PROVIDER_SITE_OTHER): Payer: Medicare Other

## 2019-03-29 DIAGNOSIS — G43909 Migraine, unspecified, not intractable, without status migrainosus: Secondary | ICD-10-CM

## 2019-03-29 DIAGNOSIS — E785 Hyperlipidemia, unspecified: Secondary | ICD-10-CM

## 2019-03-29 NOTE — Addendum Note (Signed)
Addended by: Ellamae Sia on: 03/29/2019 09:11 AM   Modules accepted: Orders

## 2019-03-30 LAB — CBC WITH DIFFERENTIAL/PLATELET
Basophils Absolute: 0.1 10*3/uL (ref 0.0–0.2)
Basos: 1 %
EOS (ABSOLUTE): 0.2 10*3/uL (ref 0.0–0.4)
Eos: 4 %
Hematocrit: 38.5 % (ref 34.0–46.6)
Hemoglobin: 13.6 g/dL (ref 11.1–15.9)
Immature Grans (Abs): 0 10*3/uL (ref 0.0–0.1)
Immature Granulocytes: 0 %
Lymphocytes Absolute: 1.9 10*3/uL (ref 0.7–3.1)
Lymphs: 39 %
MCH: 32.2 pg (ref 26.6–33.0)
MCHC: 35.3 g/dL (ref 31.5–35.7)
MCV: 91 fL (ref 79–97)
Monocytes Absolute: 0.4 10*3/uL (ref 0.1–0.9)
Monocytes: 8 %
Neutrophils Absolute: 2.5 10*3/uL (ref 1.4–7.0)
Neutrophils: 48 %
Platelets: 269 10*3/uL (ref 150–450)
RBC: 4.22 x10E6/uL (ref 3.77–5.28)
RDW: 13 % (ref 11.7–15.4)
WBC: 5 10*3/uL (ref 3.4–10.8)

## 2019-03-30 LAB — COMPREHENSIVE METABOLIC PANEL
ALT: 16 IU/L (ref 0–32)
AST: 16 IU/L (ref 0–40)
Albumin/Globulin Ratio: 2.6 — ABNORMAL HIGH (ref 1.2–2.2)
Albumin: 4.2 g/dL (ref 3.8–4.8)
Alkaline Phosphatase: 60 IU/L (ref 39–117)
BUN/Creatinine Ratio: 17 (ref 12–28)
BUN: 13 mg/dL (ref 8–27)
Bilirubin Total: 0.4 mg/dL (ref 0.0–1.2)
CO2: 23 mmol/L (ref 20–29)
Calcium: 8.8 mg/dL (ref 8.7–10.3)
Chloride: 103 mmol/L (ref 96–106)
Creatinine, Ser: 0.75 mg/dL (ref 0.57–1.00)
GFR calc Af Amer: 95 mL/min/{1.73_m2} (ref >59)
GFR calc non Af Amer: 82 mL/min/{1.73_m2} (ref >59)
Globulin, Total: 1.6 g/dL (ref 1.5–4.5)
Glucose: 104 mg/dL — ABNORMAL HIGH (ref 65–99)
Potassium: 4.8 mmol/L (ref 3.5–5.2)
Sodium: 139 mmol/L (ref 134–144)
Total Protein: 5.8 g/dL — ABNORMAL LOW (ref 6.0–8.5)

## 2019-03-30 LAB — LIPID PANEL
Chol/HDL Ratio: 2.6 ratio (ref 0.0–4.4)
Cholesterol, Total: 185 mg/dL (ref 100–199)
HDL: 70 mg/dL (ref >39)
LDL Calculated: 106 mg/dL — ABNORMAL HIGH (ref 0–99)
Triglycerides: 47 mg/dL (ref 0–149)
VLDL Cholesterol Cal: 9 mg/dL (ref 5–40)

## 2019-03-30 LAB — TSH: TSH: 2.88 u[IU]/mL (ref 0.450–4.500)

## 2019-04-01 ENCOUNTER — Encounter: Payer: Self-pay | Admitting: Family Medicine

## 2019-04-01 ENCOUNTER — Ambulatory Visit (INDEPENDENT_AMBULATORY_CARE_PROVIDER_SITE_OTHER): Payer: Medicare Other | Admitting: Family Medicine

## 2019-04-01 ENCOUNTER — Encounter: Payer: BC Managed Care – PPO | Admitting: Family Medicine

## 2019-04-01 VITALS — BP 124/80 | HR 74 | Temp 98.2°F | Ht 60.0 in | Wt 172.0 lb

## 2019-04-01 DIAGNOSIS — E785 Hyperlipidemia, unspecified: Secondary | ICD-10-CM | POA: Diagnosis not present

## 2019-04-01 DIAGNOSIS — Z0000 Encounter for general adult medical examination without abnormal findings: Secondary | ICD-10-CM

## 2019-04-01 DIAGNOSIS — G43909 Migraine, unspecified, not intractable, without status migrainosus: Secondary | ICD-10-CM | POA: Diagnosis not present

## 2019-04-01 DIAGNOSIS — Z7189 Other specified counseling: Secondary | ICD-10-CM

## 2019-04-01 DIAGNOSIS — E669 Obesity, unspecified: Secondary | ICD-10-CM

## 2019-04-01 MED ORDER — RIZATRIPTAN BENZOATE 10 MG PO TABS: ORAL_TABLET | ORAL | 6 refills | Status: AC

## 2019-04-01 NOTE — Assessment & Plan Note (Signed)
Advanced planning - does not have set up. in process of setting this up with attorney. HCPOA would be oldest son Dana Alvarez. Asked to bring [place] a copy.

## 2019-04-01 NOTE — Assessment & Plan Note (Signed)
Weight loss noted. Reviewed healthy diet and lifestyle changes to affect sustainable weight loss. She is motivated to restart swimming routine once Y opens up again.

## 2019-04-01 NOTE — Assessment & Plan Note (Signed)
Stable period - triptan refilled.

## 2019-04-01 NOTE — Assessment & Plan Note (Signed)
I have personally reviewed the Medicare Annual Wellness questionnaire and have noted 1. The patient's medical and social history 2. Their use of alcohol, tobacco or illicit drugs 3. Their current medications and supplements 4. The patient's functional ability including ADL's, fall risks, home safety risks and hearing or visual impairment. Cognitive function has been assessed and addressed as indicated.  5. Diet and physical activity 6. Evidence for depression or mood disorders The patients weight, height, BMI have been recorded in the chart. I have made referrals, counseling and provided education to the patient based on review of the above and I have provided the pt with a written personalized care plan for preventive services. Provider list updated.. See scanned questionairre as needed for further documentation. Reviewed preventative protocols and updated unless pt declined.   Questionairre has been mailed to patient, will be reviewed and scanned once she completes.

## 2019-04-01 NOTE — Progress Notes (Signed)
Virtual visit completed through [employer]. Due to national recommendations of social distancing due to [place] 19, a virtual visit is felt to be most appropriate for this patient at this time. Interactive audio and video telecommunications were attempted between myself and [employer], however failed due to patient having technical difficulties. We continued and completed visit with audio only.   Patient location: home Provider location: [place] [facility], office If any vitals were documented, they were collected by patient at home unless specified below.    BP 124/80 (BP Location: Right Arm, Patient Position: Standing)   Pulse 74   Temp 98.2 F (36.8 C) (Oral)   Ht 5' (1.524 m)   Wt 172 lb (78 kg)   SpO2 96%   BMI 33.59 kg/m    CC: AMW (initial) Subjective:    Patient ID: Dana Alvarez, female    DOB: 1951-04-19, 68 y.o.   MRN: [id number]  HPI: Dana Alvarez is a 68 y.o. female presenting on 04/01/2019 for Medicare Wellness   Has not seen Katha Cabal.  Received Medicare 07/2017.  Now occupational nurse at [employer].    No falls this past year.  Passes depression screen.  Denies trouble with hearing or vision.   Preventative: COLONOSCOPY Date: 05/2015 2 polyps, mild diverticulosis, rpt 5 yrs (Pyrtle) Well woman - [facility] sees PA for well woman. Normal mammo/pap/breast exam this year. Gets every 2 years. Overdue for mammogram DEXA WNL 2017 Flu shot yearly. Tdap 2012 prevnar 2017, pneumovax 2018 shingrix - discussed - will check with pharmacy  Advanced planning - does not have set up. in process of setting this up with attorney. HCPOA would be oldest son Jaylean Buenaventura. Asked to bring [place] a copy.  Seat belt use discussed.  Sunscreen use discussed. No changing moles on skin.  Ex smoker - quit 1989 Alcohol - 1 glass wine occasionally  Dentist Q6 mo Ey exam yearly, 2019 (beginnings of cataracts)  Adopted--part native [place]  Married; lives with husband;  dog  3 children  Occupation: Recruitment consultant at [employer], prior school Therapist, sports, prior Licensed conveyancer at [employer], prior Therapist, sports at [employer],  Activity: swims daily Diet: good water, fruits/vegetables daily      Relevant past medical, surgical, family and social history reviewed and updated as indicated. Interim medical history since our last visit reviewed. Allergies and medications reviewed and updated. Outpatient Medications Prior to Visit  Medication Sig Dispense Refill  . albuterol (VENTOLIN HFA) 108 (90 Base) MCG/ACT inhaler INHALE TWO PUFFS INTO THE LUNGS EVERY SIX HOURS AS NEEDED FOR WHEEZING 18 g 6  . Ascorbic Acid (VITAMIN C) 1000 MG tablet Take 1 tablet by mouth daily.    . cholecalciferol (VITAMIN D) 1000 units tablet Take 1,000 Units by mouth daily.    . Cyanocobalamin (VITAMIN B 12) 500 MCG TABS Take 1 tablet by mouth daily.    . Multiple Vitamins-Minerals (MULTIVITAMIN ADULT) TABS Take 1 tablet by mouth daily.    . Probiotic Product (PROBIOTIC DAILY PO) Take by mouth. Probiotic gummy -Take one daily    . rizatriptan (MAXALT) 10 MG tablet TAKE 1 TABLET BY MOUTH AT FIRST SIGN OF MIGRAINE SYMPTOMS, MAY REPEATIN 2 HOURSIF NO RELIEF 10 tablet 1  . estradiol (ESTRACE) 0.1 MG/GM vaginal cream Insert 1g nightly for 1 wk, then 1 g once weekly as maintenace (Patient not taking: Reported on 04/01/2019) 42.5 g 1  . rizatriptan (MAXALT) 10 MG tablet TAKE ONE TABLET AT FIRST SIGN OF MIGRAINE  SYMPTOMS - MAY REPEAT IN TWO HOURS IF NO RELIEF 10 tablet 3  . rizatriptan (MAXALT) 10 MG tablet     . terbinafine (LAMISIL) 250 MG tablet Take 1 tablet (250 mg total) by mouth daily. 30 tablet 0   No facility-administered medications prior to visit.      Per HPI unless specifically indicated in ROS section below Review of Systems Objective:    BP 124/80 (BP Location: Right Arm, Patient Position: Standing)   Pulse 74   Temp 98.2 F (36.8 C) (Oral)   Ht 5' (1.524 m)   Wt 172 lb (78 kg)    SpO2 96%   BMI 33.59 kg/m   Wt Readings from Last 3 Encounters:  04/01/19 172 lb (78 kg)  07/08/17 178 lb (80.7 kg)  06/23/17 175 lb (79.4 kg)     Physical exam: Gen: alert, NAD, not ill appearing Pulm: speaks in complete sentences without increased work of breathing Psych: normal mood, normal thought content  Neuro: A&O x3, identifies president, recall 3/3, calculation 5/5 serial 7s      Results for orders placed or performed in visit on 03/29/19  Lipid panel  Result Value Ref Range   Cholesterol, Total 185 100 - 199 mg/dL   Triglycerides 47 0 - 149 mg/dL   HDL 70 >39 mg/dL   VLDL Cholesterol Cal 9 5 - 40 mg/dL   LDL Calculated 106 (H) 0 - 99 mg/dL   Chol/HDL Ratio 2.6 0.0 - 4.4 ratio  Comprehensive metabolic panel  Result Value Ref Range   Glucose 104 (H) 65 - 99 mg/dL   BUN 13 8 - 27 mg/dL   Creatinine, Ser 0.75 0.57 - 1.00 mg/dL   GFR calc non Af Amer 82 >59 mL/min/1.73   GFR calc Af Amer 95 >59 mL/min/1.73   BUN/Creatinine Ratio 17 12 - 28   Sodium 139 134 - 144 mmol/L   Potassium 4.8 3.5 - 5.2 mmol/L   Chloride 103 96 - 106 mmol/L   CO2 23 20 - 29 mmol/L   Calcium 8.8 8.7 - 10.3 mg/dL   Total Protein 5.8 (L) 6.0 - 8.5 g/dL   Albumin 4.2 3.8 - 4.8 g/dL   Globulin, Total 1.6 1.5 - 4.5 g/dL   Albumin/Globulin Ratio 2.6 (H) 1.2 - 2.2   Bilirubin Total 0.4 0.0 - 1.2 mg/dL   Alkaline Phosphatase 60 39 - 117 IU/L   AST 16 0 - 40 IU/L   ALT 16 0 - 32 IU/L  TSH  Result Value Ref Range   TSH 2.880 0.450 - 4.500 uIU/mL  CBC with Differential/Platelet  Result Value Ref Range   WBC 5.0 3.4 - 10.8 x10E3/uL   RBC 4.22 3.77 - 5.28 x10E6/uL   Hemoglobin 13.6 11.1 - 15.9 g/dL   Hematocrit 38.5 34.0 - 46.6 %   MCV 91 79 - 97 fL   MCH 32.2 26.6 - 33.0 pg   MCHC 35.3 31.5 - 35.7 g/dL   RDW 13.0 11.7 - 15.4 %   Platelets 269 150 - 450 x10E3/uL   Neutrophils 48 Not Estab. %   Lymphs 39 Not Estab. %   Monocytes 8 Not Estab. %   Eos 4 Not Estab. %   Basos 1 Not Estab. %    Neutrophils Absolute 2.5 1.4 - 7.0 x10E3/uL   Lymphocytes Absolute 1.9 0.7 - 3.1 x10E3/uL   Monocytes Absolute 0.4 0.1 - 0.9 x10E3/uL   EOS (ABSOLUTE) 0.2 0.0 - 0.4 x10E3/uL   Basophils Absolute  0.1 0.0 - 0.2 x10E3/uL   Immature Granulocytes 0 Not Estab. %   Immature Grans (Abs) 0.0 0.0 - 0.1 x10E3/uL  No results found for: HGBA1C  Assessment & Plan:   Problem List Items Addressed This Visit    Obesity, Class I, BMI 30-34.9    Weight loss noted. Reviewed healthy diet and lifestyle changes to affect sustainable weight loss. She is motivated to restart swimming routine once Y opens up again.       Mild hyperlipidemia    Chronic, stable off medications. Continue to monitor. The 10-year ASCVD risk score Mikey Bussing DC Brooke Bonito., et al., 2013) is: 6.6%   Values used to calculate the score:     Age: 59 years     Sex: Female     Is Non-Hispanic African American: No     Diabetic: No     Tobacco smoker: No     Systolic Blood Pressure: 829 mmHg     Is BP treated: No     HDL Cholesterol: 70 mg/dL     Total Cholesterol: 185 mg/dL       Migraine headache    Stable period - triptan refilled.       Relevant Medications   rizatriptan (MAXALT) 10 MG tablet   Medicare annual wellness visit, initial - Primary    I have personally reviewed the Medicare Annual Wellness questionnaire and have noted 1. The patient's medical and social history 2. Their use of alcohol, tobacco or illicit drugs 3. Their current medications and supplements 4. The patient's functional ability including ADL's, fall risks, home safety risks and hearing or visual impairment. Cognitive function has been assessed and addressed as indicated.  5. Diet and physical activity 6. Evidence for depression or mood disorders The patients weight, height, BMI have been recorded in the chart. I have made referrals, counseling and provided education to the patient based on review of the above and I have provided the pt with a written  personalized care plan for preventive services. Provider list updated.. See scanned questionairre as needed for further documentation. Reviewed preventative protocols and updated unless pt declined.   Questionairre has been mailed to patient, will be reviewed and scanned once she completes.       Advanced care planning/counseling discussion    Advanced planning - does not have set up. in process of setting this up with attorney. HCPOA would be oldest son Brannon Decaire. Asked to bring [place] a copy.           Meds ordered this encounter  Medications  . rizatriptan (MAXALT) 10 MG tablet    Sig: TAKE 1 TABLET BY MOUTH AT FIRST SIGN OF MIGRAINE SYMPTOMS, MAY REPEATIN 2 HOURSIF NO RELIEF    Dispense:  10 tablet    Refill:  6   No orders of the defined types were placed in this encounter.   Follow up plan: Return in about 1 year (around 03/31/2020) for medicare wellness visit, annual exam, prior fasting for blood work.  Ria Bush, MD

## 2019-04-01 NOTE — Assessment & Plan Note (Signed)
Chronic, stable off medications. Continue to monitor. The 10-year ASCVD risk score Mikey Bussing DC Brooke Bonito., et al., 2013) is: 6.6%   Values used to calculate the score:     Age: 68 years     Sex: Female     Is Non-Hispanic African American: No     Diabetic: No     Tobacco smoker: No     Systolic Blood Pressure: 747 mmHg     Is BP treated: No     HDL Cholesterol: 70 mg/dL     Total Cholesterol: 185 mg/dL

## 2019-07-08 ENCOUNTER — Telehealth: Payer: Self-pay | Admitting: Family Medicine

## 2019-07-08 MED ORDER — ALBUTEROL SULFATE HFA 108 (90 BASE) MCG/ACT IN AERS: INHALATION_SPRAY | RESPIRATORY_TRACT | 6 refills | Status: AC

## 2019-07-08 NOTE — Telephone Encounter (Signed)
Patient has moved. Her prescription for ProAir wasn't transferred from [facility].  [facility]  Phone # [phone]. Patient said she only has an expired prescription from the move.   If possible,patient would like it done today.

## 2019-07-08 NOTE — Telephone Encounter (Signed)
Left message for patient to call back, need to see if patient is still going to come here to be seen or will establish care in [place]?

## 2019-07-08 NOTE — Telephone Encounter (Signed)
Patient is not sure if she will switch PCP. She is still planning to come here to see Dr Darnell Level and do acute visits at Urgent Care if needed at this time. Ok to fill RX?

## 2019-07-08 NOTE — Telephone Encounter (Signed)
Ok to do. Sent in.

## 2020-04-16 DIAGNOSIS — H43813 Vitreous degeneration, bilateral: Secondary | ICD-10-CM | POA: Diagnosis not present

## 2020-04-16 DIAGNOSIS — H2513 Age-related nuclear cataract, bilateral: Secondary | ICD-10-CM | POA: Diagnosis not present

## 2020-04-16 DIAGNOSIS — H524 Presbyopia: Secondary | ICD-10-CM | POA: Diagnosis not present

## 2020-04-30 DIAGNOSIS — Z6833 Body mass index (BMI) 33.0-33.9, adult: Secondary | ICD-10-CM | POA: Diagnosis not present

## 2020-04-30 DIAGNOSIS — R739 Hyperglycemia, unspecified: Secondary | ICD-10-CM | POA: Diagnosis not present

## 2020-04-30 DIAGNOSIS — G43109 Migraine with aura, not intractable, without status migrainosus: Secondary | ICD-10-CM | POA: Diagnosis not present

## 2020-04-30 DIAGNOSIS — E7800 Pure hypercholesterolemia, unspecified: Secondary | ICD-10-CM | POA: Diagnosis not present

## 2020-04-30 DIAGNOSIS — Z1211 Encounter for screening for malignant neoplasm of colon: Secondary | ICD-10-CM | POA: Diagnosis not present

## 2020-05-24 ENCOUNTER — Encounter: Payer: Self-pay | Admitting: Internal Medicine

## 2020-06-21 DIAGNOSIS — Z872 Personal history of diseases of the skin and subcutaneous tissue: Secondary | ICD-10-CM | POA: Diagnosis not present

## 2020-06-21 DIAGNOSIS — Z09 Encounter for follow-up examination after completed treatment for conditions other than malignant neoplasm: Secondary | ICD-10-CM | POA: Diagnosis not present

## 2020-06-21 DIAGNOSIS — D2371 Other benign neoplasm of skin of right lower limb, including hip: Secondary | ICD-10-CM | POA: Diagnosis not present

## 2020-06-21 DIAGNOSIS — I8393 Asymptomatic varicose veins of bilateral lower extremities: Secondary | ICD-10-CM | POA: Diagnosis not present

## 2020-06-21 DIAGNOSIS — L814 Other melanin hyperpigmentation: Secondary | ICD-10-CM | POA: Diagnosis not present

## 2020-10-26 DIAGNOSIS — E785 Hyperlipidemia, unspecified: Secondary | ICD-10-CM | POA: Diagnosis not present

## 2020-10-26 DIAGNOSIS — Z86010 Personal history of colonic polyps: Secondary | ICD-10-CM | POA: Diagnosis not present

## 2020-10-26 DIAGNOSIS — G43909 Migraine, unspecified, not intractable, without status migrainosus: Secondary | ICD-10-CM | POA: Diagnosis not present

## 2020-10-26 DIAGNOSIS — K5730 Diverticulosis of large intestine without perforation or abscess without bleeding: Secondary | ICD-10-CM | POA: Diagnosis not present

## 2020-10-26 DIAGNOSIS — Z1211 Encounter for screening for malignant neoplasm of colon: Secondary | ICD-10-CM | POA: Diagnosis not present

## 2020-10-26 DIAGNOSIS — K5909 Other constipation: Secondary | ICD-10-CM | POA: Diagnosis not present

## 2021-04-18 DIAGNOSIS — H43813 Vitreous degeneration, bilateral: Secondary | ICD-10-CM | POA: Diagnosis not present

## 2021-04-18 DIAGNOSIS — H2513 Age-related nuclear cataract, bilateral: Secondary | ICD-10-CM | POA: Diagnosis not present

## 2021-05-01 DIAGNOSIS — R739 Hyperglycemia, unspecified: Secondary | ICD-10-CM | POA: Diagnosis not present

## 2021-05-01 DIAGNOSIS — Z13820 Encounter for screening for osteoporosis: Secondary | ICD-10-CM | POA: Diagnosis not present

## 2021-05-01 DIAGNOSIS — Z1231 Encounter for screening mammogram for malignant neoplasm of breast: Secondary | ICD-10-CM | POA: Diagnosis not present

## 2021-05-01 DIAGNOSIS — E7800 Pure hypercholesterolemia, unspecified: Secondary | ICD-10-CM | POA: Diagnosis not present

## 2021-05-01 DIAGNOSIS — Z0000 Encounter for general adult medical examination without abnormal findings: Secondary | ICD-10-CM | POA: Diagnosis not present

## 2021-05-01 DIAGNOSIS — Z780 Asymptomatic menopausal state: Secondary | ICD-10-CM | POA: Diagnosis not present

## 2021-05-01 DIAGNOSIS — Z1211 Encounter for screening for malignant neoplasm of colon: Secondary | ICD-10-CM | POA: Diagnosis not present

## 2021-05-16 DIAGNOSIS — Z780 Asymptomatic menopausal state: Secondary | ICD-10-CM | POA: Diagnosis not present

## 2021-05-16 DIAGNOSIS — Z13820 Encounter for screening for osteoporosis: Secondary | ICD-10-CM | POA: Diagnosis not present

## 2021-05-16 DIAGNOSIS — Z1231 Encounter for screening mammogram for malignant neoplasm of breast: Secondary | ICD-10-CM | POA: Diagnosis not present

## 2021-06-27 DIAGNOSIS — D2371 Other benign neoplasm of skin of right lower limb, including hip: Secondary | ICD-10-CM | POA: Diagnosis not present

## 2021-06-27 DIAGNOSIS — L814 Other melanin hyperpigmentation: Secondary | ICD-10-CM | POA: Diagnosis not present

## 2021-06-27 DIAGNOSIS — L821 Other seborrheic keratosis: Secondary | ICD-10-CM | POA: Diagnosis not present

## 2022-01-09 DIAGNOSIS — H524 Presbyopia: Secondary | ICD-10-CM | POA: Diagnosis not present

## 2022-04-28 DIAGNOSIS — H2513 Age-related nuclear cataract, bilateral: Secondary | ICD-10-CM | POA: Diagnosis not present

## 2022-04-28 DIAGNOSIS — H43813 Vitreous degeneration, bilateral: Secondary | ICD-10-CM | POA: Diagnosis not present

## 2022-04-29 DIAGNOSIS — E7800 Pure hypercholesterolemia, unspecified: Secondary | ICD-10-CM | POA: Diagnosis not present

## 2022-05-01 DIAGNOSIS — Z6833 Body mass index (BMI) 33.0-33.9, adult: Secondary | ICD-10-CM | POA: Diagnosis not present

## 2022-05-01 DIAGNOSIS — Z0000 Encounter for general adult medical examination without abnormal findings: Secondary | ICD-10-CM | POA: Diagnosis not present

## 2022-05-01 DIAGNOSIS — M79671 Pain in right foot: Secondary | ICD-10-CM | POA: Diagnosis not present

## 2022-05-01 DIAGNOSIS — M25552 Pain in left hip: Secondary | ICD-10-CM | POA: Diagnosis not present

## 2022-05-01 DIAGNOSIS — E7800 Pure hypercholesterolemia, unspecified: Secondary | ICD-10-CM | POA: Diagnosis not present

## 2022-05-01 DIAGNOSIS — Z1231 Encounter for screening mammogram for malignant neoplasm of breast: Secondary | ICD-10-CM | POA: Diagnosis not present

## 2022-05-01 DIAGNOSIS — M79672 Pain in left foot: Secondary | ICD-10-CM | POA: Diagnosis not present

## 2022-05-01 DIAGNOSIS — G4762 Sleep related leg cramps: Secondary | ICD-10-CM | POA: Diagnosis not present

## 2022-05-12 DIAGNOSIS — M6281 Muscle weakness (generalized): Secondary | ICD-10-CM | POA: Diagnosis not present

## 2022-05-12 DIAGNOSIS — M25552 Pain in left hip: Secondary | ICD-10-CM | POA: Diagnosis not present

## 2022-05-12 DIAGNOSIS — R2689 Other abnormalities of gait and mobility: Secondary | ICD-10-CM | POA: Diagnosis not present

## 2022-05-15 DIAGNOSIS — M6281 Muscle weakness (generalized): Secondary | ICD-10-CM | POA: Diagnosis not present

## 2022-05-15 DIAGNOSIS — R2689 Other abnormalities of gait and mobility: Secondary | ICD-10-CM | POA: Diagnosis not present

## 2022-05-15 DIAGNOSIS — M25552 Pain in left hip: Secondary | ICD-10-CM | POA: Diagnosis not present

## 2022-05-19 DIAGNOSIS — M6281 Muscle weakness (generalized): Secondary | ICD-10-CM | POA: Diagnosis not present

## 2022-05-19 DIAGNOSIS — R2689 Other abnormalities of gait and mobility: Secondary | ICD-10-CM | POA: Diagnosis not present

## 2022-05-19 DIAGNOSIS — M25552 Pain in left hip: Secondary | ICD-10-CM | POA: Diagnosis not present

## 2022-05-19 DIAGNOSIS — Z1231 Encounter for screening mammogram for malignant neoplasm of breast: Secondary | ICD-10-CM | POA: Diagnosis not present

## 2022-05-21 DIAGNOSIS — R2689 Other abnormalities of gait and mobility: Secondary | ICD-10-CM | POA: Diagnosis not present

## 2022-05-21 DIAGNOSIS — M25552 Pain in left hip: Secondary | ICD-10-CM | POA: Diagnosis not present

## 2022-05-21 DIAGNOSIS — M6281 Muscle weakness (generalized): Secondary | ICD-10-CM | POA: Diagnosis not present

## 2022-05-26 DIAGNOSIS — M25552 Pain in left hip: Secondary | ICD-10-CM | POA: Diagnosis not present

## 2022-05-26 DIAGNOSIS — R2689 Other abnormalities of gait and mobility: Secondary | ICD-10-CM | POA: Diagnosis not present

## 2022-05-26 DIAGNOSIS — M6281 Muscle weakness (generalized): Secondary | ICD-10-CM | POA: Diagnosis not present

## 2022-05-28 DIAGNOSIS — R2689 Other abnormalities of gait and mobility: Secondary | ICD-10-CM | POA: Diagnosis not present

## 2022-05-28 DIAGNOSIS — M25552 Pain in left hip: Secondary | ICD-10-CM | POA: Diagnosis not present

## 2022-05-28 DIAGNOSIS — M6281 Muscle weakness (generalized): Secondary | ICD-10-CM | POA: Diagnosis not present

## 2022-06-02 DIAGNOSIS — M6281 Muscle weakness (generalized): Secondary | ICD-10-CM | POA: Diagnosis not present

## 2022-06-02 DIAGNOSIS — R2689 Other abnormalities of gait and mobility: Secondary | ICD-10-CM | POA: Diagnosis not present

## 2022-06-02 DIAGNOSIS — M25552 Pain in left hip: Secondary | ICD-10-CM | POA: Diagnosis not present

## 2022-06-11 DIAGNOSIS — M6281 Muscle weakness (generalized): Secondary | ICD-10-CM | POA: Diagnosis not present

## 2022-06-11 DIAGNOSIS — M25552 Pain in left hip: Secondary | ICD-10-CM | POA: Diagnosis not present

## 2022-06-11 DIAGNOSIS — R2689 Other abnormalities of gait and mobility: Secondary | ICD-10-CM | POA: Diagnosis not present

## 2022-06-13 DIAGNOSIS — R2689 Other abnormalities of gait and mobility: Secondary | ICD-10-CM | POA: Diagnosis not present

## 2022-06-13 DIAGNOSIS — M25552 Pain in left hip: Secondary | ICD-10-CM | POA: Diagnosis not present

## 2022-06-13 DIAGNOSIS — M6281 Muscle weakness (generalized): Secondary | ICD-10-CM | POA: Diagnosis not present

## 2022-06-16 DIAGNOSIS — M25552 Pain in left hip: Secondary | ICD-10-CM | POA: Diagnosis not present

## 2022-06-16 DIAGNOSIS — M6281 Muscle weakness (generalized): Secondary | ICD-10-CM | POA: Diagnosis not present

## 2022-06-16 DIAGNOSIS — R2689 Other abnormalities of gait and mobility: Secondary | ICD-10-CM | POA: Diagnosis not present

## 2022-06-23 DIAGNOSIS — M25552 Pain in left hip: Secondary | ICD-10-CM | POA: Diagnosis not present

## 2022-06-23 DIAGNOSIS — M6281 Muscle weakness (generalized): Secondary | ICD-10-CM | POA: Diagnosis not present

## 2022-06-23 DIAGNOSIS — R2689 Other abnormalities of gait and mobility: Secondary | ICD-10-CM | POA: Diagnosis not present

## 2022-06-25 DIAGNOSIS — R2689 Other abnormalities of gait and mobility: Secondary | ICD-10-CM | POA: Diagnosis not present

## 2022-06-25 DIAGNOSIS — M25552 Pain in left hip: Secondary | ICD-10-CM | POA: Diagnosis not present

## 2022-06-25 DIAGNOSIS — M6281 Muscle weakness (generalized): Secondary | ICD-10-CM | POA: Diagnosis not present

## 2022-07-07 DIAGNOSIS — L608 Other nail disorders: Secondary | ICD-10-CM | POA: Diagnosis not present

## 2022-07-07 DIAGNOSIS — L814 Other melanin hyperpigmentation: Secondary | ICD-10-CM | POA: Diagnosis not present

## 2022-07-07 DIAGNOSIS — Z872 Personal history of diseases of the skin and subcutaneous tissue: Secondary | ICD-10-CM | POA: Diagnosis not present

## 2022-07-07 DIAGNOSIS — D2371 Other benign neoplasm of skin of right lower limb, including hip: Secondary | ICD-10-CM | POA: Diagnosis not present

## 2022-07-07 DIAGNOSIS — Z09 Encounter for follow-up examination after completed treatment for conditions other than malignant neoplasm: Secondary | ICD-10-CM | POA: Diagnosis not present

## 2022-07-07 DIAGNOSIS — L821 Other seborrheic keratosis: Secondary | ICD-10-CM | POA: Diagnosis not present

## 2022-07-08 DIAGNOSIS — M6281 Muscle weakness (generalized): Secondary | ICD-10-CM | POA: Diagnosis not present

## 2022-07-08 DIAGNOSIS — M25552 Pain in left hip: Secondary | ICD-10-CM | POA: Diagnosis not present

## 2022-07-08 DIAGNOSIS — R2689 Other abnormalities of gait and mobility: Secondary | ICD-10-CM | POA: Diagnosis not present

## 2022-07-10 DIAGNOSIS — M6281 Muscle weakness (generalized): Secondary | ICD-10-CM | POA: Diagnosis not present

## 2022-07-10 DIAGNOSIS — M25552 Pain in left hip: Secondary | ICD-10-CM | POA: Diagnosis not present

## 2022-07-10 DIAGNOSIS — R2689 Other abnormalities of gait and mobility: Secondary | ICD-10-CM | POA: Diagnosis not present

## 2022-07-15 DIAGNOSIS — M6281 Muscle weakness (generalized): Secondary | ICD-10-CM | POA: Diagnosis not present

## 2022-07-15 DIAGNOSIS — R2689 Other abnormalities of gait and mobility: Secondary | ICD-10-CM | POA: Diagnosis not present

## 2022-07-15 DIAGNOSIS — M25552 Pain in left hip: Secondary | ICD-10-CM | POA: Diagnosis not present

## 2022-07-24 DIAGNOSIS — M25552 Pain in left hip: Secondary | ICD-10-CM | POA: Diagnosis not present

## 2022-07-24 DIAGNOSIS — R2689 Other abnormalities of gait and mobility: Secondary | ICD-10-CM | POA: Diagnosis not present

## 2022-07-24 DIAGNOSIS — M6281 Muscle weakness (generalized): Secondary | ICD-10-CM | POA: Diagnosis not present

## 2022-07-31 DIAGNOSIS — M6281 Muscle weakness (generalized): Secondary | ICD-10-CM | POA: Diagnosis not present

## 2022-07-31 DIAGNOSIS — R2689 Other abnormalities of gait and mobility: Secondary | ICD-10-CM | POA: Diagnosis not present

## 2022-07-31 DIAGNOSIS — M25552 Pain in left hip: Secondary | ICD-10-CM | POA: Diagnosis not present

## 2022-08-07 DIAGNOSIS — M25552 Pain in left hip: Secondary | ICD-10-CM | POA: Diagnosis not present

## 2022-08-07 DIAGNOSIS — M6281 Muscle weakness (generalized): Secondary | ICD-10-CM | POA: Diagnosis not present

## 2022-08-07 DIAGNOSIS — R2689 Other abnormalities of gait and mobility: Secondary | ICD-10-CM | POA: Diagnosis not present

## 2022-08-21 DIAGNOSIS — M6281 Muscle weakness (generalized): Secondary | ICD-10-CM | POA: Diagnosis not present

## 2022-08-21 DIAGNOSIS — M25552 Pain in left hip: Secondary | ICD-10-CM | POA: Diagnosis not present

## 2022-08-21 DIAGNOSIS — R2689 Other abnormalities of gait and mobility: Secondary | ICD-10-CM | POA: Diagnosis not present

## 2022-10-30 DIAGNOSIS — M5136 Other intervertebral disc degeneration, lumbar region: Secondary | ICD-10-CM | POA: Diagnosis not present

## 2022-10-30 DIAGNOSIS — M545 Low back pain, unspecified: Secondary | ICD-10-CM | POA: Diagnosis not present

## 2022-10-30 DIAGNOSIS — M1612 Unilateral primary osteoarthritis, left hip: Secondary | ICD-10-CM | POA: Diagnosis not present

## 2022-11-26 DIAGNOSIS — M25559 Pain in unspecified hip: Secondary | ICD-10-CM | POA: Diagnosis not present

## 2022-11-26 DIAGNOSIS — Z01818 Encounter for other preprocedural examination: Secondary | ICD-10-CM | POA: Diagnosis not present

## 2022-11-26 DIAGNOSIS — M25552 Pain in left hip: Secondary | ICD-10-CM | POA: Diagnosis not present

## 2022-11-26 DIAGNOSIS — M1612 Unilateral primary osteoarthritis, left hip: Secondary | ICD-10-CM | POA: Diagnosis not present
